# Patient Record
Sex: Female | Born: 1995 | Race: Black or African American | Hispanic: No | Marital: Single | Smoking: Never smoker
Health system: Southern US, Community
[De-identification: ages and names within clinical notes are randomized; demographics above are authoritative.]

## PROBLEM LIST (undated history)

## (undated) DIAGNOSIS — J302 Other seasonal allergic rhinitis: Secondary | ICD-10-CM

## (undated) DIAGNOSIS — H5000 Unspecified esotropia: Secondary | ICD-10-CM

## (undated) DIAGNOSIS — M6289 Other specified disorders of muscle: Secondary | ICD-10-CM

## (undated) DIAGNOSIS — Z993 Dependence on wheelchair: Secondary | ICD-10-CM

## (undated) DIAGNOSIS — G809 Cerebral palsy, unspecified: Secondary | ICD-10-CM

## (undated) DIAGNOSIS — G3184 Mild cognitive impairment, so stated: Secondary | ICD-10-CM

## (undated) DIAGNOSIS — R29898 Other symptoms and signs involving the musculoskeletal system: Secondary | ICD-10-CM

## (undated) DIAGNOSIS — G319 Degenerative disease of nervous system, unspecified: Secondary | ICD-10-CM

## (undated) DIAGNOSIS — Z87828 Personal history of other (healed) physical injury and trauma: Secondary | ICD-10-CM

## (undated) DIAGNOSIS — Z8739 Personal history of other diseases of the musculoskeletal system and connective tissue: Secondary | ICD-10-CM

## (undated) DIAGNOSIS — M25369 Other instability, unspecified knee: Secondary | ICD-10-CM

## (undated) HISTORY — DX: Cerebral palsy, unspecified: G80.9

---
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
## 2016-02-28 ENCOUNTER — Other Ambulatory Visit: Payer: Self-pay | Admitting: Pediatrics

## 2016-02-28 DIAGNOSIS — N6310 Unspecified lump in the right breast, unspecified quadrant: Secondary | ICD-10-CM

## 2016-04-27 ENCOUNTER — Ambulatory Visit
Admission: RE | Admit: 2016-04-27 | Discharge: 2016-04-27 | Disposition: A | Discharge status: Home / Self Care | Payer: Medicaid Other | Source: Ambulatory Visit | Attending: Pediatrics | Admitting: Pediatrics

## 2016-04-27 DIAGNOSIS — N6310 Unspecified lump in the right breast, unspecified quadrant: Secondary | ICD-10-CM

## 2016-05-02 NOTE — H&P (Signed)
Sheila LameJessica Roberson is an 20 y.o. female.   Chief Complaint: Esotropia,hyperopia,bilateral blurred vision, residual esotropia.  HPI: Esotropia,hyperopia,bilateral blurred vision, residual esotropia.  Past Medical History:  Diagnosis Date  . Cerebellar atrophy   . Cerebral palsy   . Developmental delay     Past Surgical History:  Procedure Laterality Date  . EXAMINATION UNDER ANESTHESIA N/A 01/01/2014   Procedure: EXAM UNDER ANESTHESIA;  Surgeon: [name], MD;  Location: [facility];  Service: Gynecology;  Laterality: N/A;  . INTRAUTERINE DEVICE (IUD) INSERTION N/A 01/01/2014   Procedure: INTRAUTERINE DEVICE (IUD) INSERTION;  Surgeon: [name], MD;  Location: [facility];  Service: Gynecology;  Laterality: N/A;  . IUD REMOVAL N/A 04/02/2014   Procedure: INTRAUTERINE DEVICE (IUD) REMOVAL;  Surgeon: [name], MD;  Location: [facility];  Service: Gynecology;  Laterality: N/A;    Family History  Problem Relation Age of Onset  . Heart failure Mother   . Hypertension Mother   . Diabetes Mother    Social History:  reports that she has never smoked. She has never used smokeless tobacco. She reports that she does not drink alcohol or use drugs.  Allergies: No Known Allergies  No prescriptions prior to admission.    No results found for this or any previous visit (from the past 48 hour(s)). No results found.  Review of Systems  Neurological: Negative for loss of consciousness.    There were no vitals taken for this visit. Physical Exam  Neurological:  Cerebella anthophy     Assessment/Plan Pt presents for Lateral rectus resection both eyes, for correction of residual esotropia both eyes.  Corinda GublerSPENCER,Micha Dosanjh A, MD 05/02/2016, 10:52 AM

## 2016-05-02 NOTE — H&P (Signed)
Sheila LameJessica Roberson is an 20 y.o. female.   Chief Complaint: Residual esotropia,hyperopia,bilateral blurred vision. ZOX:[id number]:Residual esotropia,hyperopia,bilateral blurred vision.    Past Medical History:  Diagnosis Date  . Cerebellar atrophy   . Cerebral palsy   . Developmental delay     Past Surgical History:  Procedure Laterality Date  . EXAMINATION UNDER ANESTHESIA N/A 01/01/2014   Procedure: EXAM UNDER ANESTHESIA;  Surgeon: [name], MD;  Location: [facility];  Service: Gynecology;  Laterality: N/A;  . INTRAUTERINE DEVICE (IUD) INSERTION N/A 01/01/2014   Procedure: INTRAUTERINE DEVICE (IUD) INSERTION;  Surgeon: [name], MD;  Location: [facility];  Service: Gynecology;  Laterality: N/A;  . IUD REMOVAL N/A 04/02/2014   Procedure: INTRAUTERINE DEVICE (IUD) REMOVAL;  Surgeon: [name], MD;  Location: [facility];  Service: Gynecology;  Laterality: N/A;    Family History  Problem Relation Age of Onset  . Heart failure Mother   . Hypertension Mother   . Diabetes Mother    Social History:  reports that she has never smoked. She has never used smokeless tobacco. She reports that she does not drink alcohol or use drugs.  Allergies:  Allergies  Allergen Reactions  . Perennial Rye Grass Pollen [Timothy Grass Pollen Allergen]     No prescriptions prior to admission.    No results found for this or any previous visit (from the past 48 hour(s)). No results found.  ROS  There were no vitals taken for this visit. Physical Exam  Eyes:       Assessment/Plan Pt presents for bilateral rectus resection both eyes for correction of residual esotropia both eyes.  Dewain Platz A, MD 05/02/2016, 11:18 AM

## 2016-05-10 ENCOUNTER — Encounter (HOSPITAL_BASED_OUTPATIENT_CLINIC_OR_DEPARTMENT_OTHER): Payer: Self-pay | Admitting: *Deleted

## 2016-05-15 ENCOUNTER — Encounter (HOSPITAL_BASED_OUTPATIENT_CLINIC_OR_DEPARTMENT_OTHER): Payer: Self-pay | Admitting: *Deleted

## 2016-05-15 NOTE — Progress Notes (Signed)
NPO AFTER MN.  ARRIVE AT 0745.  NEEDS HG AND URINE PREG. ?.  PT IS IN [employer] AND USES WALKER, BUT CAN STAND AND PIVOT TO STRETCHER.  PT CAN COMMUNICATE NEEDS AND SPEAK FOR HERSELF. MOTHER WILL BE WITH DOS. LM FOR DR Karleen HampshireSPENCER AGAIN FOR PRE-ORDERS ASAP.

## 2016-05-16 ENCOUNTER — Ambulatory Visit (HOSPITAL_BASED_OUTPATIENT_CLINIC_OR_DEPARTMENT_OTHER): Payer: Medicaid Other | Admitting: Anesthesiology

## 2016-05-16 ENCOUNTER — Encounter (HOSPITAL_BASED_OUTPATIENT_CLINIC_OR_DEPARTMENT_OTHER): Payer: Self-pay | Admitting: Anesthesiology

## 2016-05-16 ENCOUNTER — Encounter (HOSPITAL_BASED_OUTPATIENT_CLINIC_OR_DEPARTMENT_OTHER)
Admission: RE | Disposition: A | Discharge status: Home / Self Care | Payer: Self-pay | Source: Ambulatory Visit | Attending: Ophthalmology

## 2016-05-16 ENCOUNTER — Ambulatory Visit (HOSPITAL_BASED_OUTPATIENT_CLINIC_OR_DEPARTMENT_OTHER)
Admission: RE | Admit: 2016-05-16 | Discharge: 2016-05-16 | Disposition: A | Discharge status: Home / Self Care | Payer: Medicaid Other | Source: Ambulatory Visit | Attending: Ophthalmology | Admitting: Ophthalmology

## 2016-05-16 DIAGNOSIS — H5000 Unspecified esotropia: Secondary | ICD-10-CM | POA: Insufficient documentation

## 2016-05-16 DIAGNOSIS — G319 Degenerative disease of nervous system, unspecified: Secondary | ICD-10-CM | POA: Diagnosis not present

## 2016-05-16 DIAGNOSIS — R6250 Unspecified lack of expected normal physiological development in childhood: Secondary | ICD-10-CM | POA: Diagnosis not present

## 2016-05-16 DIAGNOSIS — G809 Cerebral palsy, unspecified: Secondary | ICD-10-CM | POA: Insufficient documentation

## 2016-05-16 HISTORY — DX: Personal history of other diseases of the musculoskeletal system and connective tissue: Z87.39

## 2016-05-16 HISTORY — DX: Dependence on wheelchair: Z99.3

## 2016-05-16 HISTORY — DX: Other symptoms and signs involving the musculoskeletal system: R29.898

## 2016-05-16 HISTORY — DX: Personal history of other (healed) physical injury and trauma: Z87.828

## 2016-05-16 HISTORY — DX: Other seasonal allergic rhinitis: J30.2

## 2016-05-16 HISTORY — PX: MEDIAN RECTUS REPAIR: SHX5301

## 2016-05-16 HISTORY — DX: Other instability, unspecified knee: M25.369

## 2016-05-16 HISTORY — DX: Unspecified esotropia: H50.00

## 2016-05-16 HISTORY — DX: Mild cognitive impairment of uncertain or unknown etiology: G31.84

## 2016-05-16 HISTORY — DX: Other specified disorders of muscle: M62.89

## 2016-05-16 LAB — POCT HEMOGLOBIN-HEMACUE: HEMOGLOBIN: 14.8 g/dL (ref 12.0–15.0)

## 2016-05-16 SURGERY — REPAIR, MUSCLE, MEDIAL RECTUS
Anesthesia: General | Site: Eye | Laterality: Bilateral

## 2016-05-16 MED ORDER — PROPOFOL 10 MG/ML IV BOLUS
INTRAVENOUS | Status: DC | PRN
  Administered 2016-05-16: 100 mg via INTRAVENOUS

## 2016-05-16 MED ORDER — GLYCOPYRROLATE 0.2 MG/ML IV SOSY
PREFILLED_SYRINGE | INTRAVENOUS | Status: DC | PRN
  Administered 2016-05-16: .3 mg via INTRAVENOUS

## 2016-05-16 MED ORDER — ONDANSETRON HCL 4 MG/2ML IJ SOLN
INTRAMUSCULAR | Status: AC
  Filled 2016-05-16: qty 2

## 2016-05-16 MED ORDER — DEXAMETHASONE SODIUM PHOSPHATE 4 MG/ML IJ SOLN
INTRAMUSCULAR | Status: DC | PRN
  Administered 2016-05-16: 10 mg via INTRAVENOUS

## 2016-05-16 MED ORDER — TOBRAMYCIN-DEXAMETHASONE 0.3-0.1 % OP OINT
TOPICAL_OINTMENT | OPHTHALMIC | Status: DC | PRN
  Administered 2016-05-16: 1 via OPHTHALMIC

## 2016-05-16 MED ORDER — KETOROLAC TROMETHAMINE 30 MG/ML IJ SOLN
INTRAMUSCULAR | Status: AC
  Filled 2016-05-16: qty 1

## 2016-05-16 MED ORDER — PHENYLEPHRINE HCL 2.5 % OP SOLN
OPHTHALMIC | Status: DC | PRN
  Administered 2016-05-16: 3 [drp] via OPHTHALMIC

## 2016-05-16 MED ORDER — MIDAZOLAM HCL 2 MG/2ML IJ SOLN
INTRAMUSCULAR | Status: AC
  Filled 2016-05-16: qty 2

## 2016-05-16 MED ORDER — BSS IO SOLN
INTRAOCULAR | Status: DC | PRN
  Administered 2016-05-16: 15 mL via INTRAOCULAR

## 2016-05-16 MED ORDER — ONDANSETRON HCL 4 MG/2ML IJ SOLN
INTRAMUSCULAR | Status: DC | PRN
  Administered 2016-05-16: 4 mg via INTRAVENOUS

## 2016-05-16 MED ORDER — LACTATED RINGERS IV SOLN
INTRAVENOUS | Status: DC
  Administered 2016-05-16: 10:00:00 via INTRAVENOUS
  Filled 2016-05-16: qty 1000

## 2016-05-16 MED ORDER — LIDOCAINE 2% (20 MG/ML) 5 ML SYRINGE
INTRAMUSCULAR | Status: AC
  Filled 2016-05-16: qty 5

## 2016-05-16 MED ORDER — ACETAMINOPHEN-CODEINE 120-12 MG/5ML PO SUSP: 5.0000 mL | Freq: Four times a day (QID) | ORAL | 0 refills | Status: AC | PRN

## 2016-05-16 MED ORDER — TOBRAMYCIN-DEXAMETHASONE 0.3-0.1 % OP OINT
1.0000 | TOPICAL_OINTMENT | Freq: Two times a day (BID) | OPHTHALMIC | 0 refills | Status: AC
Start: 2016-05-16 — End: ?

## 2016-05-16 MED ORDER — GLYCOPYRROLATE 0.2 MG/ML IV SOSY
PREFILLED_SYRINGE | INTRAVENOUS | Status: AC
  Filled 2016-05-16: qty 3

## 2016-05-16 MED ORDER — KETOROLAC TROMETHAMINE 30 MG/ML IJ SOLN
INTRAMUSCULAR | Status: DC | PRN
  Administered 2016-05-16: 30 mg via INTRAVENOUS

## 2016-05-16 MED ORDER — PHENYLEPHRINE 40 MCG/ML (10ML) SYRINGE FOR IV PUSH (FOR BLOOD PRESSURE SUPPORT)
PREFILLED_SYRINGE | INTRAVENOUS | Status: DC | PRN
  Administered 2016-05-16 (×2): 80 ug via INTRAVENOUS
  Administered 2016-05-16: 40 ug via INTRAVENOUS

## 2016-05-16 MED ORDER — FENTANYL CITRATE (PF) 100 MCG/2ML IJ SOLN
INTRAMUSCULAR | Status: AC
  Filled 2016-05-16: qty 2

## 2016-05-16 MED ORDER — DEXAMETHASONE SODIUM PHOSPHATE 10 MG/ML IJ SOLN
INTRAMUSCULAR | Status: AC
  Filled 2016-05-16: qty 1

## 2016-05-16 MED ORDER — FENTANYL CITRATE (PF) 100 MCG/2ML IJ SOLN
INTRAMUSCULAR | Status: DC | PRN
  Administered 2016-05-16 (×4): 25 ug via INTRAVENOUS

## 2016-05-16 MED ORDER — PROPOFOL 10 MG/ML IV BOLUS
INTRAVENOUS | Status: AC
  Filled 2016-05-16: qty 20

## 2016-05-16 SURGICAL SUPPLY — 34 items
APL SRG 3 HI ABS STRL LF PLS (MISCELLANEOUS) ×2
APPLICATOR DR MATTHEWS STRL (MISCELLANEOUS) ×5 IMPLANT
BANDAGE EYE OVAL (MISCELLANEOUS) IMPLANT
CAUTERY EYE LOW TEMP 1300F FIN (OPHTHALMIC RELATED) ×3 IMPLANT
CLOSURE WOUND 1/2 X4 (GAUZE/BANDAGES/DRESSINGS) ×1
CORDS BIPOLAR (ELECTRODE) IMPLANT
COVER BACK TABLE 60X90IN (DRAPES) ×3 IMPLANT
COVER MAYO STAND STRL (DRAPES) ×3 IMPLANT
DRAPE LG THREE QUARTER DISP (DRAPES) ×3 IMPLANT
DRAPE SURG 17X23 STRL (DRAPES) ×9 IMPLANT
GLOVE BIO SURGEON STRL SZ 6.5 (GLOVE) ×1 IMPLANT
GLOVE BIO SURGEONS STRL SZ 6.5 (GLOVE) ×1
GLOVE BIOGEL PI IND STRL 6.5 (GLOVE) IMPLANT
GLOVE BIOGEL PI IND STRL 7.5 (GLOVE) ×1 IMPLANT
GLOVE BIOGEL PI INDICATOR 6.5 (GLOVE) ×2
GLOVE BIOGEL PI INDICATOR 7.5 (GLOVE) ×2
GLOVE SURG SIGNA 7.5 PF LTX (GLOVE) ×3 IMPLANT
GOWN STRL REUS W/ TWL LRG LVL3 (GOWN DISPOSABLE) ×1 IMPLANT
GOWN STRL REUS W/TWL LRG LVL3 (GOWN DISPOSABLE) ×4 IMPLANT
KIT ROOM TURNOVER WOR (KITS) ×3 IMPLANT
MANIFOLD NEPTUNE II (INSTRUMENTS) IMPLANT
MARKER PEN SURG W/LABELS BLK (STERILIZATION PRODUCTS) ×3 IMPLANT
NS IRRIG 500ML POUR BTL (IV SOLUTION) ×1 IMPLANT
PACK BASIN DAY SURGERY FS (CUSTOM PROCEDURE TRAY) ×3 IMPLANT
SPEAR EYE SURGICAL ST (MISCELLANEOUS) IMPLANT
STRIP CLOSURE SKIN 1/2X4 (GAUZE/BANDAGES/DRESSINGS) ×2 IMPLANT
SUT VICRYL 6 0 S 29 12 (SUTURE) ×7 IMPLANT
SUT VICRYL 7 0 TG140 8 (SUTURE) IMPLANT
SUT VICRYL 8 0 TG140 8 (SUTURE) IMPLANT
TOWEL OR 17X24 6PK STRL BLUE (TOWEL DISPOSABLE) ×5 IMPLANT
TRAY DSU PREP LF (CUSTOM PROCEDURE TRAY) ×3 IMPLANT
TUBE CONNECTING 12'X1/4 (SUCTIONS)
TUBE CONNECTING 12X1/4 (SUCTIONS) IMPLANT
WATER STERILE IRR 500ML POUR (IV SOLUTION) ×3 IMPLANT

## 2016-05-16 NOTE — Interval H&P Note (Signed)
History and Physical Interval Note:  05/16/2016 9:40 AM  Sheila Roberson  has presented today for surgery, with the diagnosis of ESOTROPHIA  The various methods of treatment have been discussed with the patient and family. After consideration of risks, benefits and other options for treatment, the patient has consented to  Procedure(s): MEDIAN RECTUS REPAIR (Bilateral) as a surgical intervention .  The patient's history has been reviewed, patient examined, no change in status, stable for surgery.  I have reviewed the patient's chart and labs.  Questions were answered to the patient's satisfaction.     Febe Champa A   

## 2016-05-16 NOTE — Transfer of Care (Signed)
Immediate Anesthesia Transfer of Care Note  Patient: Sheila Roberson  Procedure(s) Performed: Procedure(s): LATERAL RECTUS RESECTION BOTH EYES (Bilateral)  Patient Location: PACU  Anesthesia Type:General  Level of Consciousness: sedated and responds to stimulation  Airway & Oxygen Therapy: Patient Spontanous Breathing  Post-op Assessment: Report given to RN  Post vital signs: Reviewed and stable  Last Vitals: 118/63, 98, 17, 99%, 98.6  Vitals:   05/16/16 0815 05/16/16 1112  BP: 113/73 120/64  Pulse: 80 93  Resp: 18 13  Temp: 37.1 C 37 C    Last Pain:  Vitals:   05/16/16 0815  TempSrc: Oral      Patients Stated Pain Goal: 5 (05/16/16 0847)  Complications: No apparent anesthesia complications

## 2016-05-16 NOTE — Discharge Instructions (Signed)
°  Post Anesthesia Home Care Instructions  Activity: Get plenty of rest for the remainder of the day. A responsible adult should stay with you for 24 hours following the procedure.  For the next 24 hours, DO NOT: -Drive a car -Advertising copywriterperate machinery -Drink alcoholic beverages -Take any medication unless instructed by your physician -Make any legal decisions or sign important papers.  Meals: Start with liquid foods such as gelatin or soup. Progress to regular foods as tolerated. Avoid greasy, spicy, heavy foods. If nausea and/or vomiting occur, drink only clear liquids until the nausea and/or vomiting subsides. Call your physician if vomiting continues.  Special Instructions/Symptoms: Your throat may feel dry or sore from the anesthesia or the breathing tube placed in your throat during surgery. If this causes discomfort, gargle with warm salt water. The discomfort should disappear within 24 hours.  If you had a scopolamine patch placed behind your ear for the management of post- operative nausea and/or vomiting:  1. The medication in the patch is effective for 72 hours, after which it should be removed.  Wrap patch in a tissue and discard in the trash. Wash hands thoroughly with soap and water. 2. You may remove the patch earlier than 72 hours if you experience unpleasant side effects which may include dry mouth, dizziness or visual disturbances. 3. Avoid touching the patch. Wash your hands with soap and water after contact with the patch.   Call your surgeon if you experience:   1.  Fever over 101.0. 2.  Inability to urinate. 3.  Nausea and/or vomiting. 4.  Extreme swelling or bruising at the surgical site. 5.  Continued bleeding from the incision. 6.  Increased pain, redness or drainage from the incision. 7.  Problems related to your pain medication. 8.  Any visual changes 9.  Any problems and/or concerns

## 2016-05-16 NOTE — Brief Op Note (Signed)
05/16/2016  11:05 AM  PATIENT:  Cindee LameJessica Cervini  20 y.o. female  PRE-OPERATIVE DIAGNOSIS:  ESOTROPHIA  POST-OPERATIVE DIAGNOSIS:  ESOTROPHIA  PROCEDURE:  Procedure(s): LATERAL RECTUS RESECTION BOTH EYES (Bilateral)  SURGEON:  Surgeon(s) and Role:    * Aura CampsMichael Armella Stogner, MD - Primary  PHYSICIAN ASSISTANT:   ASSISTANTS: none   ANESTHESIA:   general  EBL:  No intake/output data recorded.  BLOOD ADMINISTERED:none  DRAINS: none   LOCAL MEDICATIONS USED:  NONE  SPECIMEN:  No Specimen  DISPOSITION OF SPECIMEN:  N/A  COUNTS:  YES  TOURNIQUET:  * No tourniquets in log *  DICTATION: .Other Dictation: Dictation Number [id number]  PLAN OF CARE: Discharge to home after PACU  PATIENT DISPOSITION:  PACU - hemodynamically stable.   Delay start of Pharmacological VTE agent (>24hrs) due to surgical blood loss or risk of bleeding: yes

## 2016-05-16 NOTE — Interval H&P Note (Signed)
History and Physical Interval Note:  05/16/2016 9:41 AM  Sheila Roberson  has presented today for surgery, with the diagnosis of ESOTROPHIA  The various methods of treatment have been discussed with the patient and family. After consideration of risks, benefits and other options for treatment, the patient has consented to  Procedure(s): MEDIAN RECTUS REPAIR (Bilateral) as a surgical intervention .  The patient's history has been reviewed, patient examined, no change in status, stable for surgery.  I have reviewed the patient's chart and labs.  Questions were answered to the patient's satisfaction.     Kayah Hecker A

## 2016-05-16 NOTE — Interval H&P Note (Signed)
History and Physical Interval Note:  05/16/2016 9:40 AM  [name]  has presented today for surgery, with the diagnosis of ESOTROPHIA  The various methods of treatment have been discussed with the patient and family. After consideration of risks, benefits and other options for treatment, the patient has consented to  Procedure(s): MEDIAN RECTUS REPAIR (Bilateral) as a surgical intervention .  The patient's history has been reviewed, patient examined, no change in status, stable for surgery.  I have reviewed the patient's chart and labs.  Questions were answered to the patient's satisfaction.     Meral Geissinger A

## 2016-05-16 NOTE — Anesthesia Procedure Notes (Signed)
Procedure Name: LMA Insertion Date/Time: 05/16/2016 9:45 AM Performed by: Briant SitesENENNY, Reynalda Canny T Pre-anesthesia Checklist: Patient identified, Emergency Drugs available, Suction available and Patient being monitored Patient Re-evaluated:Patient Re-evaluated prior to inductionOxygen Delivery Method: Circle system utilized Intubation Type: Inhalational induction Ventilation: Mask ventilation without difficulty LMA: LMA flexible inserted LMA Size: 4.0 Number of attempts: 1 Placement Confirmation: positive ETCO2 Tube secured with: Tape Dental Injury: Teeth and Oropharynx as per pre-operative assessment

## 2016-05-16 NOTE — Anesthesia Preprocedure Evaluation (Signed)
Anesthesia Evaluation  Patient identified by MRN, date of birth, ID band Patient awake    Reviewed: Allergy & Precautions, NPO status , Patient's Chart, lab work & pertinent test results  Airway Mallampati: II   Neck ROM: full    Dental   Pulmonary neg pulmonary ROS,    breath sounds clear to auscultation       Cardiovascular negative cardio ROS   Rhythm:regular Rate:Normal     Neuro/Psych Cerebral palsy.  Cerebellar atrophy    GI/Hepatic   Endo/Other    Renal/GU      Musculoskeletal   Abdominal   Peds  Hematology   Anesthesia Other Findings   Reproductive/Obstetrics                             Anesthesia Physical Anesthesia Plan  ASA: II  Anesthesia Plan: General   Post-op Pain Management:    Induction: Intravenous  Airway Management Planned: Oral ETT  Additional Equipment:   Intra-op Plan:   Post-operative Plan: Extubation in OR  Informed Consent: I have reviewed the patients History and Physical, chart, labs and discussed the procedure including the risks, benefits and alternatives for the proposed anesthesia with the patient or authorized representative who has indicated his/her understanding and acceptance.     Plan Discussed with: CRNA, Anesthesiologist and Surgeon  Anesthesia Plan Comments:         Anesthesia Quick Evaluation

## 2016-05-17 ENCOUNTER — Encounter (HOSPITAL_BASED_OUTPATIENT_CLINIC_OR_DEPARTMENT_OTHER): Payer: Self-pay | Admitting: Ophthalmology

## 2016-05-17 NOTE — Op Note (Signed)
NAMCindee Roberson:  Sheila Roberson, Sheila Roberson              ACCOUNT NO.:  [id number]  MEDICAL RECORD NO.:  [id number]  LOCATION:                                 FACILITY:  PHYSICIAN:  Tyrone AppleMichael A. 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  POSTOPERATIVE DIAGNOSIS:  Status post bilateral lateral rectus resections.  PROCEDURE:  Bilateral lateral rectus recessions of 6 mm.  SURGEON:  Aura CampsMichael Ferrell Flam, MD.  ANESTHESIA:  General with laryngeal mask airway.  INDICATION FOR PROCEDURE:  Sheila LameJessica Pigeon is a 20 year old female with cerebral palsy and congenital esotropia.  She is status post bilateral medial rectus recessions in 2012 for esotropia.  She presents with residual esotropia.  This procedure is indicated to restore alignment of visual axis and restore single binocular vision and all positions of gaze.  The risks and benefits of the procedure were explained to the patient's parents.  Prior to the procedure, informed consent was obtained.  DESCRIPTION OF TECHNIQUE:  The patient taken into the operating room, placed in a supine position, and the entire face was prepped and draped in the usual sterile fashion after induction via general anesthesia and establishment of laryngeal mask airway.  My attention was first directed to the right eye.  A lid speculum was placed.  Forced duction tests were performed and found to be negative.  The globe was then held in the inferior temporal quadrant.  The eye was elevated and adducted.An Incision was made through the inferior temporal fornix and taken down to the posterior sub-tenons space.  The left lateral rectus tendon was then isolated on a Stevens hook, subsequently on a Green hook.  A second Green hook was then passed beneath the tendon and this was used to hold the  globe in an elevated and adducted position.  Next, the tendon was then carefully dissected free from its overlying muscle fascia and intermuscular septum.  Marks were placed on the tendon 6 mm from its native insertion.  The tendon was then imbricated on 6-0 Vicryl suture taking 2 locking bites at medial temporal apices at the pre-placed mark. The tendon was then advanced to its native insertion site on the pre- placed sutures via plication performing a 6 mm resection.  Sutures were tied securely and conjunctiva was repositioned.  My attention was then directed to thefellow left eye where an identical left lateral rectus recession of 6 mm was performed using the technique described above.  At the conclusion of procedure, TobraDex ointment was instilled in inferior fornices in both eyes.  There were no apparent complications.     Casimiro NeedleMichael A. Karleen HampshireSpencer, M.D.   ______________________________ Tyrone AppleMichael A. Karleen HampshireSpencer, M.D.    MAS/MEDQ  D:  05/16/2016  T:  05/17/2016  Job:  [id number]

## 2016-05-21 NOTE — Anesthesia Postprocedure Evaluation (Signed)
Anesthesia Post Note  Patient: Sheila Roberson  Procedure(s) Performed: Procedure(s) (LRB): LATERAL RECTUS RESECTION BOTH EYES (Bilateral)  Patient location during evaluation: PACU Anesthesia Type: General Level of consciousness: awake and alert and patient cooperative Pain management: pain level controlled Vital Signs Assessment: post-procedure vital signs reviewed and stable Respiratory status: spontaneous breathing and respiratory function stable Cardiovascular status: stable Anesthetic complications: no    Last Vitals:  Vitals:   05/16/16 1131 05/16/16 1200  BP: (!) 118/98 125/69  Pulse:  79  Resp: 15 16  Temp:  36.9 C    Last Pain:  Vitals:   05/17/16 1440  TempSrc:   PainSc: 0-No pain                 Myreon Wimer S

## 2016-11-27 ENCOUNTER — Other Ambulatory Visit: Payer: Self-pay | Admitting: Pediatrics

## 2016-11-27 DIAGNOSIS — N6310 Unspecified lump in the right breast, unspecified quadrant: Secondary | ICD-10-CM

## 2016-12-11 ENCOUNTER — Ambulatory Visit
Admission: RE | Admit: 2016-12-11 | Discharge: 2016-12-11 | Disposition: A | Discharge status: Home / Self Care | Payer: Self-pay | Source: Ambulatory Visit | Attending: Pediatrics | Admitting: Pediatrics

## 2016-12-11 DIAGNOSIS — N6310 Unspecified lump in the right breast, unspecified quadrant: Secondary | ICD-10-CM

## 2017-11-07 ENCOUNTER — Other Ambulatory Visit: Payer: Self-pay | Admitting: Pediatrics

## 2017-11-07 DIAGNOSIS — N6310 Unspecified lump in the right breast, unspecified quadrant: Secondary | ICD-10-CM

## 2017-12-13 ENCOUNTER — Ambulatory Visit
Admission: RE | Admit: 2017-12-13 | Discharge: 2017-12-13 | Disposition: A | Discharge status: Home / Self Care | Payer: Medicaid Other | Source: Ambulatory Visit | Attending: Pediatrics | Admitting: Pediatrics

## 2017-12-13 DIAGNOSIS — N6310 Unspecified lump in the right breast, unspecified quadrant: Secondary | ICD-10-CM

## 2018-02-21 ENCOUNTER — Encounter: Payer: Self-pay | Admitting: Certified Nurse Midwife

## 2018-02-21 ENCOUNTER — Ambulatory Visit (INDEPENDENT_AMBULATORY_CARE_PROVIDER_SITE_OTHER): Payer: Medicaid Other | Admitting: Certified Nurse Midwife

## 2018-02-21 VITALS — BP 113/75 | HR 81 | Ht 65.0 in | Wt 147.8 lb

## 2018-02-21 DIAGNOSIS — Z0000 Encounter for general adult medical examination without abnormal findings: Secondary | ICD-10-CM | POA: Diagnosis not present

## 2018-02-21 DIAGNOSIS — G809 Cerebral palsy, unspecified: Secondary | ICD-10-CM | POA: Diagnosis not present

## 2018-02-21 NOTE — Progress Notes (Signed)
Patient is in the office with her mother (authorized person) for annual. Mother states that pt had IUD removed in 2016 under sedation and [facility] LMP 02-08-18.

## 2018-02-21 NOTE — Progress Notes (Signed)
GYNECOLOGY ANNUAL PREVENTATIVE CARE ENCOUNTER NOTE  Subjective:   Sheila Roberson is a 22 y.o. G0P0 female here for a routine annual gynecologic exam.  Current complaints: none. Information obtained by mother who is the authorized person. Denies abnormal vaginal bleeding (menstrual chart reviewed), discharge, pelvic pain, problems with intercourse or other gynecologic concerns.    Gynecologic History Patient's last menstrual period was 02/08/2018. Contraception: none Has not has PAP, mother reports patient will not tolerate pelvic examinations without sedation. IUD removed in 2016 at [facility] under sedation.   Obstetric History OB History  Gravida Para Term Preterm AB Living  0 0          SAB TAB Ectopic Multiple Live Births               Past Medical History:  Diagnosis Date  . Cerebellar atrophy   . 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 Esotropia of both eyes   . History of dislocation of knee    bilateral---  wear knee braces  . Knee instability    BILATERAL WEAR BRACES  . Low muscle tone   . Mild cognitive impairment   . Seasonal allergies   . Uses wheelchair     Past Surgical History:  Procedure Laterality Date  . EXAMINATION UNDER ANESTHESIA N/A 01/01/2014   Procedure: EXAM UNDER ANESTHESIA;  Surgeon: Antionette Char, MD;  Location: [facility];  Service: Gynecology;  Laterality: N/A;  . INTRAUTERINE DEVICE (IUD) INSERTION N/A 01/01/2014   Procedure: INTRAUTERINE DEVICE (IUD) INSERTION;  Surgeon: Antionette Char, MD;  Location: [facility];  Service: Gynecology;  Laterality: N/A;  . IUD REMOVAL N/A 04/02/2014   Procedure: INTRAUTERINE DEVICE (IUD) REMOVAL;  Surgeon: Antionette Char, MD;  Location: [facility];  Service: Gynecology;  Laterality: N/A;  . MEDIAN RECTUS REPAIR Bilateral 05/16/2016   Procedure: LATERAL RECTUS RESECTION BOTH EYES;  Surgeon:  Aura Camps, MD;  Location: [facility];  Service: Ophthalmology;  Laterality: Bilateral;  . STRABISMUS SURGERY  2013    Current Outpatient Medications on File Prior to Visit  Medication Sig Dispense Refill  . acetaminophen-codeine (CAPITAL/CODEINE) 120-12 MG/5ML suspension Take 5 mLs by mouth every 6 (six) hours as needed for pain. 120 mL 0  . cetirizine HCl (ZYRTEC) 5 MG/5ML SYRP Take 5 mg by mouth as needed for allergies.    . Chlorphen-Phenyleph-ASA (ALKA-SELTZER PLUS COLD PO) Take by mouth as needed.    . diphenhydrAMINE (BENADRYL) 12.5 MG/5ML elixir Take 25 mg by mouth daily as needed for allergies.    . flintstones complete (FLINTSTONES) 60 MG chewable tablet Chew 2 tablets by mouth daily.    Marland Kitchen ibuprofen (ADVIL,MOTRIN) 200 MG tablet Take 200 mg by mouth daily as needed for moderate pain.    Marland Kitchen tobramycin-dexamethasone (TOBRADEX) ophthalmic ointment Place 1 application into both eyes 2 (two) times daily at 10 am and 4 pm. 3.5 g 0   No current facility-administered medications on file prior to visit.     Allergies  Allergen Reactions  . Perennial Rye Grass Pollen [Timothy Grass Pollen Allergen]     Social History   Socioeconomic History  . Marital status: Single    Spouse name: Not on file  . Number of children: Not on file  . Years of education: Not on file  . Highest education level: Not on file  Occupational History  .  Not on file  Social Needs  . Financial resource strain: Not on file  . Food insecurity:    Worry: Not on file    Inability: Not on file  . Transportation needs:    Medical: Not on file    Non-medical: Not on file  Tobacco Use  . Smoking status: Never Smoker  . Smokeless tobacco: Never Used  Substance and Sexual Activity  . Alcohol use: No  . Drug use: No  . Sexual activity: Never    Birth control/protection: None    Comment: PT GOES TO [place] [employer]  Lifestyle  . Physical activity:    Days per week: Not on file     Minutes per session: Not on file  . Stress: Not on file  Relationships  . Social connections:    Talks on phone: Not on file    Gets together: Not on file    Attends religious service: Not on file    Active member of club or organization: Not on file    Attends meetings of clubs or organizations: Not on file    Relationship status: Not on file  . Intimate partner violence:    Fear of current or ex partner: Not on file    Emotionally abused: Not on file    Physically abused: Not on file    Forced sexual activity: Not on file  Other Topics Concern  . Not on file  Social History Narrative  . Not on file    Family History  Problem Relation Age of Onset  . Heart failure Mother   . Hypertension Mother   . Diabetes Mother     The following portions of the patient's history were reviewed and updated as appropriate: allergies, current medications, past family history, past medical history, past social history, past surgical history and problem list.  Review of Systems Pertinent items noted in HPI and remainder of comprehensive ROS otherwise negative.   Objective:  BP 113/75   Pulse 81   Ht 5\' 5"  (1.651 m)   Wt 147 lb 12.8 oz (67 kg)   LMP 02/08/2018   BMI 24.60 kg/m  CONSTITUTIONAL: well-nourished female in no acute distress.  HENT:  Normocephalic, atraumatic, External right and left ear normal. Oropharynx is clear and moist EYES: Conjunctivae and EOM are normal. Pupils are equal, round, and reactive to light. NECK: Normal range of motion, supple, no masses.  Normal thyroid.  SKIN: Skin is warm and dry. No rash noted. Not diaphoretic. No erythema. No pallor. MUSCULOSKELETAL: No cyanosis, clubbing, or edema noted. Abnormal gait due to cerebral palsy, low muscle tone, bilateral knee braces in place. CARDIOVASCULAR: Normal heart rate noted, regular rhythm RESPIRATORY: Clear to auscultation bilaterally. Effort and breath sounds normal, no problems with respiration noted. BREASTS:  Symmetric in size. No masses palpated, no skin changes, nipple drainage, or lymphadenopathy.  ABDOMEN: Soft, normal bowel sounds, no distention noted.  No tenderness, rebound or guarding.   Assessment and Plan:  1. Encounter for annual physical examination excluding gynecological examination in a patient older than 17 years -Annual examination completed without pelvic examination due to patient having to be sedated for pelvic exam.  -Physical examination normal excluding effects of Cerebral Palsy   2. Cerebral palsy (HCC) -Patient in wheelchair, assessment and examination done while patient in wheelchair.   Routine preventative health maintenance measures emphasized. Please refer to After Visit Summary for other counseling recommendations.   Sharyon CableVeronica C Britiney Blahnik, CNM 02/21/18, 11:44 AM

## 2019-03-19 ENCOUNTER — Ambulatory Visit (INDEPENDENT_AMBULATORY_CARE_PROVIDER_SITE_OTHER): Payer: Medicaid Other | Admitting: Family Medicine

## 2019-03-19 ENCOUNTER — Other Ambulatory Visit: Payer: Self-pay

## 2019-03-19 ENCOUNTER — Encounter: Payer: Self-pay | Admitting: Family Medicine

## 2019-03-19 VITALS — BP 114/75 | HR 100 | Ht 64.25 in | Wt 150.0 lb

## 2019-03-19 DIAGNOSIS — Z0000 Encounter for general adult medical examination without abnormal findings: Secondary | ICD-10-CM | POA: Diagnosis not present

## 2019-03-19 DIAGNOSIS — Z01419 Encounter for gynecological examination (general) (routine) without abnormal findings: Secondary | ICD-10-CM

## 2019-03-19 NOTE — Progress Notes (Signed)
Pt presents for New patient AEX.

## 2019-03-19 NOTE — Progress Notes (Signed)
   GYNECOLOGY OFFICE VISIT NOTE History:  23 y.o. G0P0 here today for well-woman visit. She has a PMH of cerebral palsy with limited verbal communication, in a wheelchair. Her mother brought her in for visit today. States tried to make telehealth visit but was told she needed to come in. States daughter will be unable to do GYN exam without sedation. Not sexually active. Lives at home with parents. Has caregivers during the day. Recently finished school. Has regular periods, does not seem to be too bothered by them according to her mother. Gets flu shot every year, UTD on other vaccines. Eats relatively varied diet. Regular bowel movements. Gets some exercise at home, is in wheelchair because of knee instability. No trouble with sleeping. Mother states she feels supported, has resources she needs at this time.  The following portions of the patient's history were reviewed and updated as appropriate: allergies, current medications, past family history, past medical history, past social history, past surgical history and problem list.   Review of Systems:  Pertinent items noted in HPI. Limited ROS because of patient verbal ability. Review of Systems  Constitutional: Negative for malaise/fatigue and weight loss.  HENT: Negative for congestion.   Respiratory: Negative for cough and shortness of breath.   Gastrointestinal: Negative for constipation and diarrhea.  Genitourinary: Negative for frequency and urgency.  Skin: Negative for rash.    Objective:  Physical Exam BP 114/75   Pulse 100   Ht 5' 4.25" (1.632 m)   Wt 150 lb (68 kg)   LMP 02/26/2019   BMI 25.55 kg/m  Physical Exam  Constitutional: She appears well-developed and well-nourished. No distress.  Interactive, able to wave and say hello and goodbye, points to things desired, able to follow commands  HENT:  Head: Normocephalic and atraumatic.  Right Ear: External ear normal.  Left Ear: External ear normal.  Eyes: Conjunctivae and  EOM are normal. Right eye exhibits no discharge. Left eye exhibits no discharge.  Neck: Neck supple. No thyromegaly present.  Cardiovascular: Normal rate.  Pulmonary/Chest: Effort normal. No respiratory distress.  Genitourinary:    Genitourinary Comments: deferred   Musculoskeletal:        General: No edema.  Lymphadenopathy:    She has no cervical adenopathy.  Neurological: She exhibits normal muscle tone.  Alert and interactive  Skin: Skin is warm and dry. No rash noted.  Psychiatric: She has a normal mood and affect.  Nursing note and vitals reviewed.  Labs and Imaging No results found for this or any previous visit (from the past 168 hour(s)). No results found.  Assessment & Plan:  Sheila Roberson is a 23yo with PMH of cerebral palsy who presents for well-woman visit.  -- counseled mother on routine preventive items -- discussed recommendations for Pap smear, mother thinks she has had HPV vaccine series -- continue healthy diet and exercise as able to aid in mobility, healthy weight   Total face-to-face time with patient: 20 minutes.  Over 50% of encounter was spent on counseling and coordination of care.  Lambert Mody. Juleen China, DO OB Family Medicine Fellow, [facility] for [employer], [place]

## 2019-08-28 ENCOUNTER — Ambulatory Visit: Payer: Medicaid Other | Attending: Nurse Practitioner

## 2019-08-28 ENCOUNTER — Other Ambulatory Visit: Payer: Self-pay

## 2019-08-28 DIAGNOSIS — R2689 Other abnormalities of gait and mobility: Secondary | ICD-10-CM

## 2019-08-28 DIAGNOSIS — M6281 Muscle weakness (generalized): Secondary | ICD-10-CM | POA: Diagnosis present

## 2019-08-28 NOTE — Therapy (Addendum)
[facility] [address] [place], [place], [address] Phone: [phone]   Fax:  [phone]  Physical Therapy Evaluation  Patient Details  Name: Sheila Roberson MRN: [id number] Date of Birth: 1996-07-27 Referring Provider (PT): Azzie Roup, FNP   Encounter Date: 08/28/2019  PT End of Session - 08/28/19 1357    Visit Number  1    Number of Visits  7    Date for PT Re-Evaluation  10/27/19    Authorization Type  Medicaid    PT Start Time  1400    PT Stop Time  1443    PT Time Calculation (min)  43 min    Equipment Utilized During Treatment  --   posterior walker   Activity Tolerance  Patient tolerated treatment well    Behavior During Therapy  [facility] for tasks assessed/performed       Past Medical History:  Diagnosis Date  . Cerebellar atrophy (HCC)   . 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 Esotropia of both eyes   . History of dislocation of knee    bilateral---  wear knee braces  . Knee instability    BILATERAL WEAR BRACES  . Low muscle tone   . Mild cognitive impairment   . Seasonal allergies   . Uses wheelchair     Past Surgical History:  Procedure Laterality Date  . EXAMINATION UNDER ANESTHESIA N/A 01/01/2014   Procedure: EXAM UNDER ANESTHESIA;  Surgeon: Antionette Char, MD;  Location: [facility];  Service: Gynecology;  Laterality: N/A;  . INTRAUTERINE DEVICE (IUD) INSERTION N/A 01/01/2014   Procedure: INTRAUTERINE DEVICE (IUD) INSERTION;  Surgeon: Antionette Char, MD;  Location: [facility];  Service: Gynecology;  Laterality: N/A;  . IUD REMOVAL N/A 04/02/2014   Procedure: INTRAUTERINE DEVICE (IUD) REMOVAL;  Surgeon: Antionette Char, MD;  Location: [facility];  Service: Gynecology;  Laterality: N/A;  . MEDIAN RECTUS REPAIR Bilateral 05/16/2016   Procedure: LATERAL RECTUS RESECTION BOTH EYES;   Surgeon: Aura Camps, MD;  Location: [facility];  Service: Ophthalmology;  Laterality: Bilateral;  . STRABISMUS SURGERY  2013    There were no vitals filed for this visit.   Subjective Assessment - 08/28/19 1356    Subjective  Pt is 23 y/o female referred for cerebellar atrophty with cerebral palsy. Mother present and reports they are here to get letter for grab bar in bathroom. Mother also reports that she is also having more knee instabiliy with patellas subluxing if she does not wear her knee braces. Pt is nonverbal. Mom reports that she is walking on treadmill slowly 30 min 3 x/week. Pt participate in softball and bowling at [facility] [place].    Patient is accompained by:  Family member    Pertinent History  cerebral palsy    Patient Stated Goals  Pt would like to be able to return to her sports of softball and bowling and perform better. Per mother, it would be nice if she could stand and use walker for softball instead of being pushed in w/c.    Currently in Pain?  No/denies         [employer] PT Assessment - 08/28/19 1411      Assessment   Medical Diagnosis  cerebellar atrophy    Referring Provider (PT)  Azzie Roup, FNP    Onset Date/Surgical Date  08/26/19  referral date, chronic condition   Hand Dominance  Right      Precautions   Precautions  Fall      Balance Screen   Has the patient fallen in the past 6 months  No    Has the patient had a decrease in activity level because of a fear of falling?   Yes    Is the patient reluctant to leave their home because of a fear of falling?   Yes      [place] residence    Living Arrangements  Parent    Available Help at Discharge  Family    Type of [place] entrance;Stairs to enter    Entrance Stairs-Number of Steps  2    Entrance Stairs-Rails  Right;Left;Can reach both    [place]  One level    Home Equipment  Wheelchair - [employer]  - 2 wheels;Bedside commode;Grab bars - tub/shower;Tub bench   crocodile walker     Prior Function   Level of Independence  Needs assistance with ADLs;Needs assistance with gait    Vocation  Student   just graduated   Leisure  softball and bowling through [employer] assists with buttons and such and making sure clothes are on straight. Mom assist with bathing. Pt walks in home with assist with crocodile gait trainer walker. Uses w/c when participating in sports and walks with walker in stores with mother assist.      Cognition   Overall Cognitive Status  --   nonverbal but follows commands >80% of time     Sensation   Light Touch  Appears Intact      ROM / Strength   AROM / PROM / Strength  Strength      Strength   Overall Strength Comments  Pt is grossly 4/5 UE and BLE strength.      Transfers   Transfers  Sit to Stand;Stand to Sit    Sit to Stand  4: Min guard    Five time sit to stand comments   28 sec from wheelchair    Stand to Sit  4: Min guard      Ambulation/Gait   Ambulation/Gait  Yes    Ambulation/Gait Assistance  4: Min guard;4: Min assist    Ambulation/Gait Assistance Details  PT assisted to maneuver walker with turning and in tighter quarters.    Ambulation Distance (Feet)  150 Feet    Assistive device  --   posterior walker   Gait Pattern  Step-through pattern    Ambulation Surface  Level;Indoor      Balance   Balance Assessed  Yes      High Level Balance   High Level Balance Comments  Pt stood about 5 sec without UE support with taking step away from wheelchair. Tends to brace legs against back of chair for support most of time.                Objective measurements completed on examination: See above findings.              PT Education - 08/28/19 1926    Education Details  Pt and mother educated on PT plan of care    Person(s) Educated  Patient;Parent(s)    Methods  Explanation    Comprehension  Verbalized  understanding       PT Short  Term Goals - 08/28/19 1949      PT SHORT TERM GOAL #1   Title  STG=LTG        PT Long Term Goals - 08/28/19 1949      PT LONG TERM GOAL #1   Title  Pt and family with be instructed in HEP for balance, strength and walking program to continue gains at home.    Baseline  currently only walking on treadmill 3x/week    Time  6    Period  Weeks    Status  New    Target Date  10/23/19   due to delay in scheduling     PT LONG TERM GOAL #2   Title  Pt will be able to stand at posterior walking and mimic t-ball batting for improved ability to participate in softball.    Baseline  currently has to perform from w/c.    Time  6    Period  Weeks    Status  New    Target Date  10/23/19      PT LONG TERM GOAL #3   Title  Pt will ambulate >300' with posterior walker supervision/CGA for improved gait safety and to be able to walk around  bases in softball.    Baseline  150' with posterior walker min assist to manuever, using w/c pushed by aide in softball.    Time  6    Period  Weeks    Status  New    Target Date  10/23/19             Plan - 08/28/19 1928    Clinical Impression Statement  Pt is 23 y/o with cerebellar atrophy and cerebral palsy. Pt presents with mother to help obtain medical necessity for grab bar installation in bathroom. Per video of bathroom in home from patient's mother, patient had been utilizing towel rack in front of toilet to steady herself when toileting. Wall is starting to crack as towel rack not meant to support weight. Pt is high fall risk and needs support to maintain standing. Pt braces legs on chair behind her to maintain balance. Without UE support, only able to stand about 5 sec with close SBA for safety. Pt will benefit from grab bar to improve safety with toileting. Pt has decreased coordination with gait and ambulates with posterior walker needing assist to guide for safety. Pt would like to improve ability to  participate in special needs sports with use of walker for softball if possible. Pt will benefit from PT to work on balance, strengthening and gait HEP that parents can continue with at home.    Examination-Activity Limitations  Transfers;Locomotion Level;Toileting;Stand;Bathing    Examination-Participation Restrictions  Community Activity    Stability/Clinical Decision Making  Stable/Uncomplicated    Clinical Decision Making  Moderate    Rehab Potential  Good    PT Frequency  1x / week   plus eval   PT Duration  6 weeks    PT Treatment/Interventions  ADLs/Self Care Home Management;DME Instruction;Gait training;Stair training;Functional mobility training;Therapeutic activities;Therapeutic exercise;Patient/family education;Neuromuscular re-education;Balance training    PT Next Visit Plan  Balance training working on standing in posterior walker with activities to simulate softball batting, gait training with posterior walker    Consulted and Agree with Plan of Care  Patient;Family member/caregiver    Family Member Consulted  mom       Patient will benefit from skilled therapeutic intervention in order to improve the following deficits and impairments:  Abnormal gait, Decreased balance, Decreased coordination, Postural dysfunction, Decreased strength, Decreased knowledge of use of DME  Visit Diagnosis: Other abnormalities of gait and mobility  Muscle weakness (generalized)     Problem List There are no problems to display for this patient.   Ronn Melena, PT, DPT, NCS 08/28/2019, 7:58 PM  [facility] [address] [place], [place], [address] Phone: [phone]   Fax:  [phone]  Name: Sheila Roberson MRN: [id number] Date of Birth: [birth year]     Letter of Medical Necessity:  To Whom it may concern, Tonnia Bardin is a 23 y/o with cerebellar atrophy and cerebral palsy. Pt presents with mother to help obtain  medical necessity for grab bar installation in bathroom. PT was not able to go to home to perform evaluation. Per video of bathroom in home from patient's mother, patient had been utilizing towel rack in front of toilet to steady herself when toileting. Wall is starting to crack as towel rack not meant to support weight and at danger of pulling out of wall. Pt is high fall risk and needs support to maintain standing. Pt braces legs on chair behind her to maintain balance. Without UE support, only able to stand about 5 sec with close SBA for safety. Pt will benefit from grab bar to improve safety with toileting positioned in front of toilet. Pt is also having difficulty with getting in to tub as unable to use transfer bench safely due to toilet being in the way preventing her from sliding in. Mother has to remove chair and assist her to step in which has become increasingly more difficult to clear edge of bed. Pt would benefit from modified tub to make more handicap accessible with lower edge. This would further increase her safety as is high fall risk. Pt has had multiple falls in tub.  Elmer Bales, PT, DPT, NCS

## 2019-09-17 ENCOUNTER — Encounter: Payer: Medicaid Other | Admitting: Occupational Therapy

## 2019-09-18 ENCOUNTER — Other Ambulatory Visit: Payer: Self-pay

## 2019-09-18 ENCOUNTER — Ambulatory Visit: Payer: Medicaid Other | Attending: Nurse Practitioner

## 2019-09-18 ENCOUNTER — Ambulatory Visit: Payer: Medicaid Other | Admitting: Occupational Therapy

## 2019-09-18 DIAGNOSIS — R41840 Attention and concentration deficit: Secondary | ICD-10-CM

## 2019-09-18 DIAGNOSIS — R2689 Other abnormalities of gait and mobility: Secondary | ICD-10-CM

## 2019-09-18 DIAGNOSIS — R278 Other lack of coordination: Secondary | ICD-10-CM | POA: Diagnosis present

## 2019-09-18 DIAGNOSIS — M6281 Muscle weakness (generalized): Secondary | ICD-10-CM | POA: Diagnosis present

## 2019-09-18 DIAGNOSIS — R29818 Other symptoms and signs involving the nervous system: Secondary | ICD-10-CM

## 2019-09-18 NOTE — Therapy (Signed)
[facility] [address] [place], [place], [address] Phone: [phone]   Fax:  [phone]  Occupational Therapy Treatment  Patient Details  Name: Sheila Roberson MRN: [id number] Date of Birth: 1996-09-08 Referring Provider (OT): Tobie Lords FNP   Encounter Date: 09/18/2019  OT End of Session - 09/18/19 0854    Visit Number  1    Number of Visits  6    Date for OT Re-Evaluation  11/02/19    Authorization Type  Medicaid    OT Start Time  0803    OT Stop Time  0841    OT Time Calculation (min)  38 min       Past Medical History:  Diagnosis Date  . Cerebellar atrophy ([place])   . 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 Esotropia of both eyes   . History of dislocation of knee    bilateral---  wear knee braces  . Knee instability    BILATERAL WEAR BRACES  . Low muscle tone   . Mild cognitive impairment   . Seasonal allergies   . Uses wheelchair     Past Surgical History:  Procedure Laterality Date  . EXAMINATION UNDER ANESTHESIA N/A 01/01/2014   Procedure: EXAM UNDER ANESTHESIA;  Surgeon: Lahoma Crocker, MD;  Location: [facility];  Service: Gynecology;  Laterality: N/A;  . INTRAUTERINE DEVICE (IUD) INSERTION N/A 01/01/2014   Procedure: INTRAUTERINE DEVICE (IUD) INSERTION;  Surgeon: Lahoma Crocker, MD;  Location: [facility];  Service: Gynecology;  Laterality: N/A;  . IUD REMOVAL N/A 04/02/2014   Procedure: INTRAUTERINE DEVICE (IUD) REMOVAL;  Surgeon: Lahoma Crocker, MD;  Location: [facility];  Service: Gynecology;  Laterality: N/A;  . MEDIAN RECTUS REPAIR Bilateral 05/16/2016   Procedure: LATERAL RECTUS RESECTION BOTH EYES;  Surgeon: Gevena Cotton, MD;  Location: [facility];  Service: Ophthalmology;  Laterality: Bilateral;  . STRABISMUS SURGERY  2013    There were no vitals filed for  this visit.  Subjective Assessment - 09/18/19 0853    Patient Stated Goals  per pt's mother writing better, using scissors    Currently in Pain?  No/denies         [facility] OT Assessment - 09/18/19 3710      Assessment   Medical Diagnosis  cerebellar atrophy    Referring Provider (OT)  Tobie Lords FNP    Onset Date/Surgical Date  08/26/19    Hand Dominance  Right      Precautions   Precautions  Fall      Home  Environment   Family/patient expects to be discharged to:  Private residence    Available Help at Discharge  Family    Type of Home  --   mother   Bathroom Shower/Tub  Tub/Shower unit    Bathroom Accessibility  Yes    Home Equipment  Tub bench;Grab bars - toilet;Grab bars - tub/shower    Lives With  Family      Prior Function   Level of Independence  Needs assistance with ADLs;Needs assistance with gait    Vocation  Student    Leisure  softball and bowling through [place] with posterior walker at home      ADL   Eating/Feeding  Set up   needs assist with cutting food   Grooming  --  min-mod A hand over hand assit   Upper Body Bathing  Maximal assistance    Lower Body Bathing  Maximal assistance    Upper Body Dressing  Set up;Supervision/safety   except pt reuoires assist with bra   Lower Body Dressing  Set up;Supervision/safety    Toilet Transfer  Modified independent   grab bars   Tub/Shower Transfer  Supervision/safety   has tub bench   ADL comments  has aide who assist s with ADLS      Mobility   Mobility Status  --   supervison/ min A with walker     Written Expression   Dominant Hand  Right    Handwriting  --   currently traces items with 75% accuracy   Written Experience  --    writes J with 50% accuracy, max v.c, max A using scissors     Vision Assessment   Vision Assessment  Vision not tested      Cognition   Overall Cognitive Status  History of cognitive impairments - at baseline    Attention   Sustained    Sustained Attention  Impaired    Problem Solving  Impaired    Behaviors  Restless   Pt non verbal requiring v.c for initiation    Cognition Comments  mostly non verbal    inconsistently following 1 step commands     Sensation   Light Touch  Appears Intact      Coordination   Gross Motor Movements are Fluid and Coordinated  No    Fine Motor Movements are Fluid and Coordinated  No    Coordination  impaired bilaterally, able to place pegs and remove with both hands for 9 hole peg test, not timed, multi drops.      Tone   Assessment Location  Right Upper Extremity;Left Upper Extremity      ROM / Strength   AROM / PROM / Strength  AROM      AROM   Overall AROM   Within functional limits for tasks performed      Strength   Overall Strength Comments  Pt is grossly 4/5 UE      Hand Function   Right Hand Grip (lbs)  8.5    Left Hand Grip (lbs)  6.3      RUE Tone   RUE Tone  Hypotonic      RUE Tone   Hypotonic Details  mild      LUE Tone   LUE Tone  Hypotonic      LUE Tone   Hypotonic Details  mild                            OT Long Term Goals - 09/18/19 0855      OT LONG TERM GOAL #1   Title  Pt/ caregiever will be I with HEP.    Baseline  dependent    Time  5    Period  Weeks    Status  New    Target Date  11/02/19      OT LONG TERM GOAL #2   Title  Pt will trace letters, numbers and shapes with 90% accuracy.    Baseline  grossly75% accuracy for tracing    Time  5    Period  Weeks    Status  New      OT LONG TERM GOAL #3   Title  Pt will copy the letters  of her first name with 50% accuracy.    Baseline  Pt is currently tracing her name, she is able to to  copy 1 lettter of her name  with 50% accuracy.    Time  5    Period  Weeks    Status  New      OT LONG TERM GOAL #4   Title  Pt will use scissors with mod A or better.    Baseline  max A for scissor use    Time  5    Period  Weeks    Status  New      OT LONG TERM  GOAL #5   Title  Pt's caregiver will verbalize understanding of AE, or DME to assist with safety and I with ADLs/ IADLS prn.    Baseline  Pt may benefit from additional AE, or DME prn to assist with ADLs/ IADLs.    Time  5    Period  Weeks    Status  New            Plan - 09/18/19 0859    Clinical Impression Statement  Pt is 24 y/o female referred for cerebellar atrophty with cerebral palsy. Mother present and reports pt finished high school last year. Pt is nonverbal.Pt's mother has been working with her at home on school activities. Pt participates in softball and bowling at [facility] [employer] [facility] [place]. Pt presents with the following deficits: decreased strength, decreased fine motor coordination, decreased balance, decreased functional mobility, cognitive deficits which impedes perfromance of ADLs/ IADLs. Pt can benefit from skilled occupational therapy to maximize pt's safety and indpendence with daily activities.    OT Occupational Profile and History  Problem Focused Assessment - Including review of records relating to presenting problem    Occupational performance deficits (Please refer to evaluation for details):  ADL's;IADL's;Play;Social Participation;Leisure    Body Structure / Function / Physical Skills  ADL;UE functional use;Balance;FMC;ROM;Gait;Coordination;GMC;Decreased knowledge of precautions;Decreased knowledge of use of DME;IADL;Strength;Dexterity;Mobility    Cognitive Skills  Attention;Problem Solve;Safety Awareness;Sequencing;Thought;Understand    Rehab Potential  Good    Clinical Decision Making  Several treatment options, min-mod task modification necessary    Comorbidities Affecting Occupational Performance:  May have comorbidities impacting occupational performance    Modification or Assistance to Complete Evaluation   Min-Moderate modification of tasks or assist with assess necessary to complete eval    OT Frequency  1x / week    OT Duration  --   5 weeks   OT  Treatment/Interventions  Self-care/ADL training;Energy conservation;Patient/family education;DME and/or AE instruction;Passive range of motion;Gait Training;Balance training;Fluidtherapy;Cryotherapy;Functional Mobility Training;Splinting;Therapeutic activities;Manual Therapy;Therapeutic exercise;Moist Heat;Neuromuscular education;Cognitive remediation/compensation    Plan  HEP for coordination and grip strength, tracing activities    Consulted and Agree with Plan of Care  Patient;Family member/caregiver       Patient will benefit from skilled therapeutic intervention in order to improve the following deficits and impairments:   Body Structure / Function / Physical Skills: ADL, UE functional use, Balance, FMC, ROM, Gait, Coordination, GMC, Decreased knowledge of precautions, Decreased knowledge of use of DME, IADL, Strength, Dexterity, Mobility Cognitive Skills: Attention, Problem Solve, Safety Awareness, Sequencing, Thought, Understand     Visit Diagnosis: Muscle weakness (generalized) - Plan: Ot plan of care cert/re-cert  Other abnormalities of gait and mobility - Plan: Ot plan of care cert/re-cert  Other lack of coordination - Plan: Ot plan of care cert/re-cert  Attention and concentration deficit - Plan: Ot plan of care cert/re-cert  Other symptoms and signs involving the nervous system - Plan: Ot plan of care cert/re-cert    Problem List There are no problems to display for this patient.   Jasean Ambrosia 09/18/2019, 9:18 AM Keene Breath, OTR/L [phone] Phone: [phone] 9:18 AM 09/18/19 [facility] [address] [place], [place], [address] Phone: [phone]   Fax:  [phone]  Name: Sheila Roberson MRN: [id number] Date of Birth: Jun 12, 1996

## 2019-09-18 NOTE — Therapy (Signed)
[facility] [address] [place], [place], [address] Phone: [phone]   Fax:  [phone]  Physical Therapy Treatment  Patient Details  Name: Sheila Roberson MRN: [id number] Date of Birth: September 19, 1995 Referring Provider (PT): Azzie Roup, FNP   Encounter Date: 09/18/2019  PT End of Session - 09/18/19 0859    Visit Number  2    Number of Visits  7    Date for PT Re-Evaluation  10/27/19    Authorization Type  3 visits 1/8-1/28/2021    Authorization - Visit Number  1    Authorization - Number of Visits  3    PT Start Time  0845    PT Stop Time  0928    PT Time Calculation (min)  43 min    Equipment Utilized During Treatment  --   posterior walker   Activity Tolerance  Patient tolerated treatment well    Behavior During Therapy  [facility] for tasks assessed/performed       Past Medical History:  Diagnosis Date  . Cerebellar atrophy (HCC)   . 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 Esotropia of both eyes   . History of dislocation of knee    bilateral---  wear knee braces  . Knee instability    BILATERAL WEAR BRACES  . Low muscle tone   . Mild cognitive impairment   . Seasonal allergies   . Uses wheelchair     Past Surgical History:  Procedure Laterality Date  . EXAMINATION UNDER ANESTHESIA N/A 01/01/2014   Procedure: EXAM UNDER ANESTHESIA;  Surgeon: Antionette Char, MD;  Location: [facility];  Service: Gynecology;  Laterality: N/A;  . INTRAUTERINE DEVICE (IUD) INSERTION N/A 01/01/2014   Procedure: INTRAUTERINE DEVICE (IUD) INSERTION;  Surgeon: Antionette Char, MD;  Location: [facility];  Service: Gynecology;  Laterality: N/A;  . IUD REMOVAL N/A 04/02/2014   Procedure: INTRAUTERINE DEVICE (IUD) REMOVAL;  Surgeon: Antionette Char, MD;  Location: [facility];  Service: Gynecology;  Laterality: N/A;  . MEDIAN  RECTUS REPAIR Bilateral 05/16/2016   Procedure: LATERAL RECTUS RESECTION BOTH EYES;  Surgeon: Aura Camps, MD;  Location: [facility];  Service: Ophthalmology;  Laterality: Bilateral;  . STRABISMUS SURGERY  2013    There were no vitals filed for this visit.  Subjective Assessment - 09/18/19 0859    Subjective  Pt's mom reports having issues with getting pt in and out of tub as hard to step in and tub bench not working due to proximity of toilet. Requesting PT to add lower tub to letter of medical necessity. Pt has not had any new falls.    Patient is accompained by:  Family member    Pertinent History  cerebral palsy    Patient Stated Goals  Pt would like to be able to return to her sports of softball and bowling and perform better. Per mother, it would be nice if she could stand and use walker for softball instead of being pushed in w/c.    Currently in Pain?  No/denies                       [facility] Adult PT Treatment/Exercise - 09/18/19 0001      Transfers   Transfers  Sit to Stand;Stand to Sit    Sit to Stand  4: Min guard  Stand to Sit  4: Min guard      Ambulation/Gait   Ambulation/Gait  Yes    Ambulation/Gait Assistance  4: Min guard;4: Min assist    Ambulation/Gait Assistance Details  PT assisted to help maneuver posterior walker as one in clinic did not stop posterior motion and harder for patient to turn. Pt scissors with turning.    Ambulation Distance (Feet)  115 Feet    Assistive device  --   posterior walker   Gait Pattern  Step-through pattern;Scissoring    Ambulation Surface  Level;Indoor      Therapeutic Activites    Therapeutic Activities  Other Therapeutic Activities    Other Therapeutic Activities  Discussion with patient's mother who was asking about need for lower tub to hep improve safety with shower transfer. Per patient's mother and video that mother showed therapist pt is unable to utilize tube transfer bench to slide in due  to proximity of toilet. Mother has to remove bench when patient gets in shower and assist her to step in which has become more challenging with mother reporting multiple falls. PT had already requested grab bar be installed near toilet and added option of lower tub wall to letter of medical necessity to help this situation.      Neuro Re-ed    Neuro Re-ed Details   Standing in posterior walker with PT stabilizing walker to prevent it going backwards:attempted to get patient to let go with both hands but patient not wanting to, performed reaching with 1 UE in varied directions x 30 sec;  holding lightweight purple ball in both hands and raising overhead x 6 . Pt leaning posterior slightly against walker and given verbal cues to stand up straight, tossing ball back and forth with mom catching and PT guarding patient x 3 min, standing throwing bean bags from tray next to her in to basket x 14 with patient holding with 1 hand on walker, pt standing swinging plastic rod to knock tennis ball off another plastice rod x 6 to mimick her swinging softball bat. Pt's mom assist to set up ball and get ball so PT could guard. Pt performed seated in w/c kicking soccerball back and forth x 4 each leg. Mother participating throughout and instructed in how she could perform at home with caregiver. Explained that one person would need to stay to pt for safety while other person assisting with ball activities. Mother felt good with trying at home and excited to add to current exercises.             PT Education - 09/18/19 1050    Education Details  See neuro-reed section for standing and sitting functional activities mimicking her sport activities with mother and caregiver assisting- standing ball toss, standing hitting tball with bat, sitting soccerball kicks.    Person(s) Educated  Patient;Parent(s)    Methods  Explanation;Demonstration    Comprehension  Verbalized understanding;Returned demonstration       PT  Short Term Goals - 08/28/19 1949      PT SHORT TERM GOAL #1   Title  STG=LTG        PT Long Term Goals - 08/28/19 1949      PT LONG TERM GOAL #1   Title  Pt and family with be instructed in HEP for balance, strength and walking program to continue gains at home.    Baseline  currently only walking on treadmill 3x/week    Time  6    Period  Weeks    Status  New    Target Date  10/23/19   due to delay in scheduling     PT LONG TERM GOAL #2   Title  Pt will be able to stand at posterior walking and mimic t-ball batting for improved ability to participate in softball.    Baseline  currently has to perform from w/c.    Time  6    Period  Weeks    Status  New    Target Date  10/23/19      PT LONG TERM GOAL #3   Title  Pt will ambulate >300' with posterior walker supervision/CGA for improved gait safety and to be able to walk around  bases in softball.    Baseline  150' with posterior walker min assist to manuever, using w/c pushed by aide in softball.    Time  6    Period  Weeks    Status  New    Target Date  10/23/19            Plan - 09/18/19 1051    Clinical Impression Statement  Pt and mother instructed in standing balance HEP that was focused on mimicking her sport activities that she enjoyed. Pt does lean posterior some against walker. Her posterior walker at home prevents going back.    Examination-Activity Limitations  Transfers;Locomotion Level;Toileting;Stand;Bathing    Examination-Participation Restrictions  Community Activity    Stability/Clinical Decision Making  Stable/Uncomplicated    Rehab Potential  Good    PT Frequency  1x / week   plus eval   PT Duration  6 weeks    PT Treatment/Interventions  ADLs/Self Care Home Management;DME Instruction;Gait training;Stair training;Functional mobility training;Therapeutic activities;Therapeutic exercise;Patient/family education;Neuromuscular re-education;Balance training    PT Next Visit Plan  How has initial HEP of  sport simulation activities been going? Balance training working on standing in posterior walker with activities to simulate softball batting, gait training with posterior walker. Pt does much better with game activities. Perhaps try step negotiation as mother reported some decreased stability with descent with legs shaking.    Consulted and Agree with Plan of Care  Patient;Family member/caregiver    Family Member Consulted  mom       Patient will benefit from skilled therapeutic intervention in order to improve the following deficits and impairments:  Abnormal gait, Decreased balance, Decreased coordination, Postural dysfunction, Decreased strength, Decreased knowledge of use of DME  Visit Diagnosis: Other abnormalities of gait and mobility  Muscle weakness (generalized)     Problem List There are no problems to display for this patient.   Electa Sniff, PT, DPT, NCS 09/18/2019, 10:55 AM  [facility] [address] [place], [place], [address] Phone: [phone]   Fax:  [phone]  Name: Demetra Moya MRN: [id number] Date of Birth: [birth year]

## 2019-09-22 ENCOUNTER — Ambulatory Visit: Payer: Medicaid Other | Admitting: Physical Therapy

## 2019-09-23 ENCOUNTER — Other Ambulatory Visit: Payer: Self-pay

## 2019-09-23 ENCOUNTER — Ambulatory Visit (INDEPENDENT_AMBULATORY_CARE_PROVIDER_SITE_OTHER): Payer: Medicaid Other

## 2019-09-23 VITALS — Wt 163.0 lb

## 2019-09-23 DIAGNOSIS — N6311 Unspecified lump in the right breast, upper outer quadrant: Secondary | ICD-10-CM | POA: Diagnosis not present

## 2019-09-23 DIAGNOSIS — N6320 Unspecified lump in the left breast, unspecified quadrant: Secondary | ICD-10-CM | POA: Insufficient documentation

## 2019-09-23 DIAGNOSIS — N6321 Unspecified lump in the left breast, upper outer quadrant: Secondary | ICD-10-CM | POA: Diagnosis not present

## 2019-09-23 DIAGNOSIS — N6310 Unspecified lump in the right breast, unspecified quadrant: Secondary | ICD-10-CM | POA: Insufficient documentation

## 2019-09-23 NOTE — Patient Instructions (Signed)
Breast Cyst  A breast cyst is a sac in the breast that is filled with fluid. They are usually noncancerous (benign) and are common among women. Breast cysts are most often in the upper, outer portion of the breast. One or more cysts may develop. They form when fluid builds up inside the breast glands. There are several types of breast cysts. Some are too small to feel, but these can be seen with imaging tests such as an X-ray of the breast (mammogram) or ultrasound. Breast cysts do not increase your risk of breast cancer. They usually disappear after you no longer have a menstrual cycle (after menopause), unless you take artificial hormones (are on hormone therapy). What are the causes? This condition may be caused by:  Blockage of tubes (ducts) in the breast glands, which leads to fluid buildup. Duct blockage may result from: ? Fibrocystic breast changes. This is a common, benign condition that occurs when women go through hormonal changes during the menstrual cycle. This is a common cause of multiple breast cysts. ? Overgrowth of breast tissue or breast glands. ? Scar tissue in the breast from previous surgery.  Changes in certain female hormones (estrogen and progesterone). The exact cause of this condition is not known. What increases the risk? You may be more likely to develop breast cysts if you have not gone through menopause. What are the signs or symptoms? Symptoms of this condition include:  Feeling one or more smooth, round, soft lumps (like grapes) in the breast that are easily movable. The lump or lumps may get bigger and more painful before your menstrual period and get smaller after your menstrual period.  Breast discomfort or pain. How is this diagnosed? This condition may be diagnosed based on:  A physical exam. A cyst can be felt by your health care provider during this exam.  Imaging tests, such as mammogram or ultrasound. Fluid may be removed from the cyst with a  needle (fine-needle aspiration) and tested to make sure the cyst is not cancerous. How is this treated? Treatment may not be needed for this condition. Your health care provider may monitor the cyst to see if it goes away on its own. If the cyst is uncomfortable or gets bigger, or if you do not like how the cyst makes your breast look, you may need treatment. Treatment may include:  Hormone therapy.  Fine-needle aspiration to drain fluid from the cyst. There is a chance of the cyst coming back (recurring) after aspiration.  Surgery to remove the cyst. Follow these instructions at home: Self-exams   Do a breast self-exam every month, or as often as directed. A breast self-exam involves: ? Comparing your breasts in the mirror. ? Looking for visible changes in your skin or nipples. ? Feeling for lumps or changes.  Having many breast cysts may make it harder to feel for new lumps. Understand how your breasts normally look and feel, and write down any changes in your breasts. Tell your health care provider about any changes. Eating and drinking  Follow instructions from your health care provider about eating and drinking restrictions.  Drink enough fluid to keep your urine pale yellow.  Avoid caffeine.  Cut down on salt (sodium) in what you eat and drink, especially before your menstrual period. Too much sodium can cause fluid buildup, breast swelling, and discomfort. General instructions  See your health care provider regularly. ? Get a yearly physical exam. ? If you are 20-40 years old, get a   clinical breast exam every 1-3 years. After the age of 40 years, get this exam every year. ? Get mammograms as often as directed.  Take over-the-counter and prescription medicines only as told by your health care provider.  Wear a supportive bra, especially when exercising.  Keep all follow-up visits as told by your health care provider. This is important. Contact a health care provider  if:  You feel, or think you feel, a lump in your breast.  You notice that both breasts look or feel different than usual.  Your breast is still causing pain after your menstrual period is over.  You find new lumps or bumps that were not there before.  You feel lumps in your armpit. Get help right away if:  You have severe pain, tenderness, redness, or warmth in your breast.  You have fluid or blood leaking from your nipple.  Your breast lump becomes hard and painful.  You notice dimpling or wrinkling of the breast or nipple. Summary  A breast cyst is a sac in the breast that is filled with fluid.  Treatment may not be needed for this condition.  If the cyst is uncomfortable or gets bigger, or if you do not like how the cyst makes your breast look, you may need treatment. This information is not intended to replace advice given to you by your health care provider. Make sure you discuss any questions you have with your health care provider. Document Revised: 01/13/2019 Document Reviewed: 01/13/2019 Elsevier Patient Education  2020 Else[employer] Inc.  

## 2019-09-23 NOTE — Progress Notes (Signed)
GYNECOLOGY PROBLEM OFFICE VISIT NOTE  History:  Sheila Roberson is a 24 y.o. female who is a G0P0 here today for breast lump.  She comes with her mother who is her primary guardian and caretaker.  Mother, Sheila Roberson, reports that she noticed a recent enlargement of a known breast lump on Sheila Roberson's right breast.  She states that Sheila Roberson has been receiving ultrasounds of her breast since she was 56 with no significant findings. Sheila Roberson, she states they were not able to go in 2020 d/t Covid. Sheila Roberson states that she gave Sheila Roberson a bath on Sunday and noticed that the breast lump has enlarged and also moved as it was once by the nipple.  Sheila Roberson denies nipple discharge or skin changes.  Although unable to verbalize, Sheila Roberson states that the area is not painful. Sheila Roberson further reports that Sheila Roberson's paternal aunt has known fibrocystic breast and that she frequently has to get them drained.  Sheila Roberson reports Sheila Roberson's LMP was 09/08/2020 and was normal. Sheila Roberson expresses no other concerns regarding Sheila Roberson.   Past Medical History:  Diagnosis Date  . Cerebellar atrophy ([place])   . 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 Esotropia of both eyes   . History of dislocation of knee    bilateral---  wear knee braces  . Knee instability    BILATERAL WEAR BRACES  . Low muscle tone   . Mild cognitive impairment   . Seasonal allergies   . Uses wheelchair     Past Surgical History:  Procedure Laterality Date  . EXAMINATION UNDER ANESTHESIA N/A 01/01/2014   Procedure: EXAM UNDER ANESTHESIA;  Surgeon: Lahoma Crocker, MD;  Location: [facility];  Service: Gynecology;  Laterality: N/A;  . INTRAUTERINE DEVICE (IUD) INSERTION N/A 01/01/2014   Procedure: INTRAUTERINE DEVICE (IUD) INSERTION;  Surgeon: Lahoma Crocker, MD;  Location: [facility];  Service: Gynecology;  Laterality: N/A;  . IUD  REMOVAL N/A 04/02/2014   Procedure: INTRAUTERINE DEVICE (IUD) REMOVAL;  Surgeon: Lahoma Crocker, MD;  Location: [facility];  Service: Gynecology;  Laterality: N/A;  . MEDIAN RECTUS REPAIR Bilateral 05/16/2016   Procedure: LATERAL RECTUS RESECTION BOTH EYES;  Surgeon: Gevena Cotton, MD;  Location: [facility];  Service: Ophthalmology;  Laterality: Bilateral;  . STRABISMUS SURGERY  2013    The following portions of the patient's history were reviewed and updated as appropriate: allergies, current medications, past family history, past medical history, past social history, past surgical history and problem list.   Health Maintenance:  No pap history d/t CP and need for sedation for pelvic exams.  Normal breast ultrasounds in Feb 2017, Aug 2017, April 2018, and April 2019.    Review of Systems:  Review of Systems - History obtained from mother and unobtainable from patient due to Nonverbal status.  Breast ROS: positive for - new or changing breast lumps negative for - nipple changes or nipple discharge   Objective:  Vitals: Wt 163 lb (73.9 kg)   BMI 27.76 kg/m   Physical Exam: Physical Exam Constitutional:      Appearance: Normal appearance.  HENT:     Head: Normocephalic and atraumatic.  Eyes:     Conjunctiva/sclera: Conjunctivae normal.  Cardiovascular:     Rate and Rhythm: Normal rate and regular rhythm.     Heart sounds: Normal heart sounds.  Pulmonary:  Effort: Pulmonary effort is normal. No respiratory distress.     Breath sounds: Normal breath sounds.  Chest:     Breasts:        Right: Tenderness present. No inverted nipple, nipple discharge or skin change.        Left: Mass and tenderness present. No inverted nipple, nipple discharge or skin change.       Comments: Exam limited by patient's refusal to lay flat on exam table.  Lymphadenopathy:     Upper Body:     Right upper body: Axillary adenopathy present.  Neurological:     Mental Status: She is  alert.     Comments: Alert, Responsive to Yes/No Questions Appropriately with Physical replies.   Skin:    General: Skin is warm and dry.  Psychiatric:        Mood and Affect: Mood normal.        Behavior: Behavior normal.     Labs and Imaging: No results found.  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 Jhaveri informed that due to perceived increase in size and tenderness with palpation would send for new ultrasound. -Will also order ultrasound for new mass on left breast near nipple.  -Informed that ultrasound will be completed at [facility]. -Ms. Whan without further questions or concerns.  -Order placed for complete right and left breast US including axillary. -Discussed plan to [facility] in 6 months for annual well woman exam. -Encouraged to call if questions or concerns arise prior to that time.    Total face-to-face time with patient: 15 minutes   Lyndsay, Talamante, CNM 09/23/2019 8:45 AM

## 2019-09-23 NOTE — Progress Notes (Signed)
RGYN pt presents for problem visit today.   C/O: Lump in Right breast    Pt mom states she was told she would need a evaluation / referral in order to get imaging studies.  Per pt mom lump feels  Bigger   LMP: 09/09/2019

## 2019-10-01 ENCOUNTER — Ambulatory Visit: Payer: Medicaid Other

## 2019-10-02 ENCOUNTER — Other Ambulatory Visit: Payer: Self-pay

## 2019-10-02 ENCOUNTER — Ambulatory Visit
Admission: RE | Admit: 2019-10-02 | Discharge: 2019-10-02 | Disposition: A | Discharge status: Home / Self Care | Payer: Medicaid Other | Source: Ambulatory Visit

## 2019-10-02 DIAGNOSIS — N6310 Unspecified lump in the right breast, unspecified quadrant: Secondary | ICD-10-CM

## 2019-10-02 DIAGNOSIS — N6320 Unspecified lump in the left breast, unspecified quadrant: Secondary | ICD-10-CM

## 2019-10-16 ENCOUNTER — Other Ambulatory Visit: Payer: Self-pay

## 2019-10-16 ENCOUNTER — Ambulatory Visit: Payer: Medicaid Other | Attending: Nurse Practitioner | Admitting: Occupational Therapy

## 2019-10-16 DIAGNOSIS — R278 Other lack of coordination: Secondary | ICD-10-CM | POA: Diagnosis present

## 2019-10-16 DIAGNOSIS — R2689 Other abnormalities of gait and mobility: Secondary | ICD-10-CM | POA: Diagnosis present

## 2019-10-16 DIAGNOSIS — M6281 Muscle weakness (generalized): Secondary | ICD-10-CM | POA: Diagnosis not present

## 2019-10-16 DIAGNOSIS — R41840 Attention and concentration deficit: Secondary | ICD-10-CM | POA: Insufficient documentation

## 2019-10-16 DIAGNOSIS — R29818 Other symptoms and signs involving the nervous system: Secondary | ICD-10-CM | POA: Diagnosis present

## 2019-10-16 NOTE — Therapy (Signed)
[facility] [address] [place], [place], [address] Phone: [phone]   Fax:  [phone]  Occupational Therapy Treatment  Patient Details  Name: Sheila Roberson MRN: [id number] Date of Birth: 09-23-1995 Referring Provider (OT): Azzie Roup FNP   Encounter Date: 10/16/2019  OT End of Session - 10/16/19 0901    Visit Number  2    Number of Visits  6    Date for OT Re-Evaluation  11/02/19    Authorization Type  Medicaid    Authorization Time Period  5 visits through 11/19/19    Authorization - Visit Number  1    Authorization - Number of Visits  5    OT Start Time  0849    OT Stop Time  0930    OT Time Calculation (min)  41 min    Activity Tolerance  Patient tolerated treatment well    Behavior During Therapy  [facility] for tasks assessed/performed       Past Medical History:  Diagnosis Date  . Cerebellar atrophy (HCC)   . 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 Esotropia of both eyes   . History of dislocation of knee    bilateral---  wear knee braces  . Knee instability    BILATERAL WEAR BRACES  . Low muscle tone   . Mild cognitive impairment   . Seasonal allergies   . Uses wheelchair     Past Surgical History:  Procedure Laterality Date  . EXAMINATION UNDER ANESTHESIA N/A 01/01/2014   Procedure: EXAM UNDER ANESTHESIA;  Surgeon: Antionette Char, MD;  Location: [facility];  Service: Gynecology;  Laterality: N/A;  . INTRAUTERINE DEVICE (IUD) INSERTION N/A 01/01/2014   Procedure: INTRAUTERINE DEVICE (IUD) INSERTION;  Surgeon: Antionette Char, MD;  Location: [facility];  Service: Gynecology;  Laterality: N/A;  . IUD REMOVAL N/A 04/02/2014   Procedure: INTRAUTERINE DEVICE (IUD) REMOVAL;  Surgeon: Antionette Char, MD;  Location: [facility];  Service: Gynecology;  Laterality: N/A;  . MEDIAN RECTUS REPAIR  Bilateral 05/16/2016   Procedure: LATERAL RECTUS RESECTION BOTH EYES;  Surgeon: Aura Camps, MD;  Location: [facility];  Service: Ophthalmology;  Laterality: Bilateral;  . STRABISMUS SURGERY  2013    There were no vitals filed for this visit.  Subjective Assessment - 10/16/19 1233    Patient Stated Goals  per pt's mother writing better, using scissors    Currently in Pain?  No/denies             Treatment: tracing circles then letter of the alphabet, mod v.c and occasional hand over hand assist. Pt remains easily distracted requiring re-directs.              OT Education - 10/16/19 1157    Education Details  Pt/ mother were instucted in yellow theraputty HEP for grip and pinch, pt returned demonstraion with therapist modeling and mod v.c    Person(s) Educated  Patient;Parent(s)    Methods  Explanation;Demonstration;Verbal cues    Comprehension  Verbalized understanding;Returned demonstration;Verbal cues required          OT Long Term Goals - 09/18/19 0855      OT LONG TERM GOAL #1   Title  Pt/ caregiever witll be I with HEP.    Baseline  dependent    Time  5    Period  Weeks  Status  New    Target Date  11/02/19      OT LONG TERM GOAL #2   Title  Pt will trace letter, numbers and shapes with 90% accuracy.    Baseline  grossly75% accuracy for tracing    Time  5    Period  Weeks    Status  New      OT LONG TERM GOAL #3   Title  Pt will copy the letters of her first name with 50% accuracy.    Baseline  Pt is currently tracing her name, she is able to to  copy 1 lettter of her name  with 50% accuracy.    Time  5    Period  Weeks    Status  New      OT LONG TERM GOAL #4   Title  Pt will use scissors with mod A or better.    Baseline  max A for scissor use    Time  5    Period  Weeks    Status  New      OT LONG TERM GOAL #5   Title  Pt's caregiver will verbalize understanding of AE, or DME to assist with safety and I with ADLs/  IADLS prn.    Baseline  Pt may benefit from additional AE, or DME prn to assist with ADLs/ IADLs.    Time  5    Period  Weeks    Status  New            Plan - 10/16/19 1155    Clinical Impression Statement  Pt is progressing towards goals. She performed tracing activities in a busy environment and participated well in yellow theraputty HEP.    OT Occupational Profile and History  Problem Focused Assessment - Including review of records relating to presenting problem    Occupational performance deficits (Please refer to evaluation for details):  ADL's;IADL's;Play;Social Participation;Leisure    Body Structure / Function / Physical Skills  ADL;UE functional use;Balance;FMC;ROM;Gait;Coordination;GMC;Decreased knowledge of precautions;Decreased knowledge of use of DME;IADL;Strength;Dexterity;Mobility    Cognitive Skills  Attention;Problem Solve;Safety Awareness;Sequencing;Thought;Understand    Rehab Potential  Good    Clinical Decision Making  Several treatment options, min-mod task modification necessary    Comorbidities Affecting Occupational Performance:  May have comorbidities impacting occupational performance    Modification or Assistance to Complete Evaluation   Min-Moderate modification of tasks or assist with assess necessary to complete eval    OT Frequency  1x / week    OT Duration  --   5 weeks   OT Treatment/Interventions  Self-care/ADL training;Energy conservation;Patient/family education;DME and/or AE instruction;Passive range of motion;Gait Training;Balance training;Fluidtherapy;Cryotherapy;Functional Mobility Training;Splinting;Therapeutic activities;Manual Therapy;Therapeutic exercise;Moist Heat;Neuromuscular education;Cognitive remediation/compensation    Plan  HEP for coordination, tracing activities, check on how putty ex are going    Consulted and Agree with Plan of Care  Patient;Family member/caregiver       Patient will benefit from skilled therapeutic intervention  in order to improve the following deficits and impairments:   Body Structure / Function / Physical Skills: ADL, UE functional use, Balance, FMC, ROM, Gait, Coordination, GMC, Decreased knowledge of precautions, Decreased knowledge of use of DME, IADL, Strength, Dexterity, Mobility Cognitive Skills: Attention, Problem Solve, Safety Awareness, Sequencing, Thought, Understand     Visit Diagnosis: Muscle weakness (generalized)  Other abnormalities of gait and mobility  Other lack of coordination  Attention and concentration deficit  Other symptoms and signs involving the nervous system  Problem List Patient Active Problem List   Diagnosis Date Noted  . Bilateral breast lump 09/23/2019    Sheila Roberson 10/16/2019, 12:33 PM  [facility] [address] [place], [place], [address] Phone: [phone]   Fax:  [phone]  Name: Sheila Roberson MRN: [id number] Date of Birth: [birth year]

## 2019-10-23 ENCOUNTER — Ambulatory Visit: Payer: Medicaid Other | Admitting: Occupational Therapy

## 2019-10-30 ENCOUNTER — Ambulatory Visit: Payer: Medicaid Other | Admitting: Occupational Therapy

## 2019-11-06 ENCOUNTER — Other Ambulatory Visit: Payer: Self-pay

## 2019-11-06 ENCOUNTER — Ambulatory Visit: Payer: Medicaid Other | Admitting: Occupational Therapy

## 2019-11-06 DIAGNOSIS — R278 Other lack of coordination: Secondary | ICD-10-CM

## 2019-11-06 DIAGNOSIS — R41840 Attention and concentration deficit: Secondary | ICD-10-CM

## 2019-11-06 DIAGNOSIS — M6281 Muscle weakness (generalized): Secondary | ICD-10-CM | POA: Diagnosis not present

## 2019-11-06 DIAGNOSIS — R29818 Other symptoms and signs involving the nervous system: Secondary | ICD-10-CM

## 2019-11-06 NOTE — Therapy (Signed)
[facility] [address] [place], [place], [address] Phone: [phone]   Fax:  [phone]  Occupational Therapy Treatment  Patient Details  Name: Sheila Roberson MRN: [id number] Date of Birth: [birth year] Referring Provider (OT): Tobie Lords FNP   Encounter Date: 11/06/2019  OT End of Session - 11/06/19 1034    Visit Number  3    Number of Visits  6    Date for OT Re-Evaluation  11/02/19    Authorization Type  Medicaid    Authorization Time Period  5 visits through 11/19/19    Authorization - Visit Number  2    Authorization - Number of Visits  5    OT Start Time  [id number]    OT Stop Time  0930    OT Time Calculation (min)  39 min    Activity Tolerance  Patient tolerated treatment well    Behavior During Therapy  [facility] for tasks assessed/performed       Past Medical History:  Diagnosis Date  . Cerebellar atrophy ([place])   . 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 Esotropia of both eyes   . History of dislocation of knee    bilateral---  wear knee braces  . Knee instability    BILATERAL WEAR BRACES  . Low muscle tone   . Mild cognitive impairment   . Seasonal allergies   . Uses wheelchair     Past Surgical History:  Procedure Laterality Date  . EXAMINATION UNDER ANESTHESIA N/A 01/01/2014   Procedure: EXAM UNDER ANESTHESIA;  Surgeon: Lahoma Crocker, MD;  Location: [facility];  Service: Gynecology;  Laterality: N/A;  . INTRAUTERINE DEVICE (IUD) INSERTION N/A 01/01/2014   Procedure: INTRAUTERINE DEVICE (IUD) INSERTION;  Surgeon: Lahoma Crocker, MD;  Location: [facility];  Service: Gynecology;  Laterality: N/A;  . IUD REMOVAL N/A 04/02/2014   Procedure: INTRAUTERINE DEVICE (IUD) REMOVAL;  Surgeon: Lahoma Crocker, MD;  Location: [facility];  Service: Gynecology;  Laterality: N/A;  . MEDIAN RECTUS REPAIR  Bilateral 05/16/2016   Procedure: LATERAL RECTUS RESECTION BOTH EYES;  Surgeon: Gevena Cotton, MD;  Location: [facility];  Service: Ophthalmology;  Laterality: Bilateral;  . STRABISMUS SURGERY  2013    There were no vitals filed for this visit.  Subjective Assessment - 11/06/19 1034    Patient Stated Goals  per pt's mother writing better, using scissors    Currently in Pain?  No/denies              Treatment: Tracing letters with marker, mod v.c, min-mod difficulty Discussed use of foam grips on utensils and pt practiced cutting using a butter knife with foam grips, demonstration and mod v.c Pt's mother was provided with info for purchase of scoop dish as pt has used before. Reviewed yellow putty HEP, pt returned demonstration with therapsit demo and min-mod v.c Pt simulated wring out a washcloth.                  OT Long Term Goals - 11/06/19 0903      OT LONG TERM GOAL #1   Title  Pt/ caregiever witll be I with HEP.    Baseline  dependent    Time  5    Period  Weeks    Status  Achieved      OT LONG TERM GOAL #2  Title  Pt will trace letter, numbers and shapes with 90% accuracy.    Baseline  grossly75% accuracy for tracing    Time  5    Period  Weeks    Status  On-going      OT LONG TERM GOAL #3   Title  Pt will copy the letters of her first name with 50% accuracy.    Baseline  Pt is currently tracing her name, she is able to to  copy 1 lettter of her name  with 50% accuracy.    Time  5    Period  Weeks    Status  On-going      OT LONG TERM GOAL #4   Title  Pt will use scissors with mod A or better.    Baseline  max A for scissor use    Time  5    Period  Weeks    Status  New      OT LONG TERM GOAL #5   Title  Pt's caregiver will verbalize understanding of AE, or DME to assist with safety and I with ADLs/ IADLS prn.    Baseline  Pt may benefit from additional AE, or DME prn to assist with ADLs/ IADLs.    Time  5    Period   Weeks    Status  On-going            Plan - 11/06/19 1036    Clinical Impression Statement  Pt is progressing towards goals. Pt returned demonstration of yellow theraputty exercises. Pt's mother verbalizes understanding of use of foam grips for utensils and scoop dish.    OT Occupational Profile and History  Problem Focused Assessment - Including review of records relating to presenting problem    Occupational performance deficits (Please refer to evaluation for details):  ADL's;IADL's;Play;Social Participation;Leisure    Body Structure / Function / Physical Skills  ADL;UE functional use;Balance;FMC;ROM;Gait;Coordination;GMC;Decreased knowledge of precautions;Decreased knowledge of use of DME;IADL;Strength;Dexterity;Mobility    Cognitive Skills  Attention;Problem Solve;Safety Awareness;Sequencing;Thought;Understand    Rehab Potential  Good    Clinical Decision Making  Several treatment options, min-mod task modification necessary    Comorbidities Affecting Occupational Performance:  May have comorbidities impacting occupational performance    Modification or Assistance to Complete Evaluation   Min-Moderate modification of tasks or assist with assess necessary to complete eval    OT Frequency  1x / week    OT Duration  --   5 weeks   OT Treatment/Interventions  Self-care/ADL training;Energy conservation;Patient/family education;DME and/or AE instruction;Passive range of motion;Gait Training;Balance training;Fluidtherapy;Cryotherapy;Functional Mobility Training;Splinting;Therapeutic activities;Manual Therapy;Therapeutic exercise;Moist Heat;Neuromuscular education;Cognitive remediation/compensation    Plan  continue to work towards unmet goals, anticipate d/c next visit    Consulted and Agree with Plan of Care  Patient;Family member/caregiver       Patient will benefit from skilled therapeutic intervention in order to improve the following deficits and impairments:   Body Structure /  Function / Physical Skills: ADL, UE functional use, Balance, FMC, ROM, Gait, Coordination, GMC, Decreased knowledge of precautions, Decreased knowledge of use of DME, IADL, Strength, Dexterity, Mobility Cognitive Skills: Attention, Problem Solve, Safety Awareness, Sequencing, Thought, Understand     Visit Diagnosis: Muscle weakness (generalized)  Other lack of coordination  Attention and concentration deficit  Other symptoms and signs involving the nervous system    Problem List Patient Active Problem List   Diagnosis Date Noted  . Bilateral breast lump 09/23/2019    Donis Pinder 11/06/2019, 10:39 AM  [facility] [address] [place], [place], [address] Phone: [phone]   Fax:  [phone]  Name: Purity Irmen MRN: [id number] Date of Birth: 11-30-1995

## 2019-11-13 ENCOUNTER — Ambulatory Visit: Payer: Medicaid Other | Admitting: Occupational Therapy

## 2020-04-01 ENCOUNTER — Other Ambulatory Visit: Payer: Self-pay

## 2020-04-01 ENCOUNTER — Ambulatory Visit
Admission: RE | Admit: 2020-04-01 | Discharge: 2020-04-01 | Disposition: A | Discharge status: Home / Self Care | Payer: Medicaid Other | Source: Ambulatory Visit

## 2020-04-01 DIAGNOSIS — N6310 Unspecified lump in the right breast, unspecified quadrant: Secondary | ICD-10-CM

## 2020-04-01 DIAGNOSIS — N6320 Unspecified lump in the left breast, unspecified quadrant: Secondary | ICD-10-CM

## 2020-06-23 NOTE — Therapy (Signed)
[facility] [address] [place], [place], [address] Phone: [phone]   Fax:  [phone]  Patient Details  Name: Murna Backer MRN: [id number] Date of Birth: 1996-06-16 Referring Provider:  No ref. provider found  Encounter Date: 06/23/2020  PHYSICAL THERAPY DISCHARGE SUMMARY Non visit  Visits from Start of Care: 2  Current functional level related to goals / functional outcomes: Unknown as pt did not return after 2nd visit. Last visit was 09/18/19.    Remaining deficits: At last visit pt was having continued balance, strength and functional mobility deficits and mother was caregiver.    Education / Equipment: HEP  Plan: Patient agrees to discharge.  Patient goals were not met. Patient is being discharged due to not returning since the last visit.  ?????         Electa Sniff, PT, DPT, NCS 06/23/2020, 3:15 PM  [facility] [address] [place], [place], [address] Phone: [phone]   Fax:  [phone]

## 2020-06-24 ENCOUNTER — Ambulatory Visit: Payer: Medicaid Other | Attending: Family Medicine

## 2020-06-24 ENCOUNTER — Other Ambulatory Visit: Payer: Self-pay

## 2020-06-24 DIAGNOSIS — R29818 Other symptoms and signs involving the nervous system: Secondary | ICD-10-CM | POA: Diagnosis not present

## 2020-06-24 DIAGNOSIS — R2689 Other abnormalities of gait and mobility: Secondary | ICD-10-CM | POA: Insufficient documentation

## 2020-06-24 DIAGNOSIS — M6281 Muscle weakness (generalized): Secondary | ICD-10-CM | POA: Diagnosis present

## 2020-06-24 NOTE — Therapy (Signed)
[facility] [address] [place], [place], [address] Phone: [phone]   Fax:  [phone]  Physical Therapy Evaluation  Patient Details  Name: Sheila Roberson MRN: [id number] Date of Birth: 07-19-1996 Referring Provider (PT): Abran Duke, MD    Encounter Date: 06/24/2020   PT End of Session - 06/24/20 0925    Visit Number 1    Number of Visits 1    PT Start Time 0850    PT Stop Time 0927    PT Time Calculation (min) 37 min    Equipment Utilized During Treatment Gait belt    Activity Tolerance Patient tolerated treatment well    Behavior During Therapy Restless           Past Medical History:  Diagnosis Date  . Cerebellar atrophy (HCC)   . 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 Esotropia of both eyes   . History of dislocation of knee    bilateral---  wear knee braces  . Knee instability    BILATERAL WEAR BRACES  . Low muscle tone   . Mild cognitive impairment   . Seasonal allergies   . Uses wheelchair     Past Surgical History:  Procedure Laterality Date  . EXAMINATION UNDER ANESTHESIA N/A 01/01/2014   Procedure: EXAM UNDER ANESTHESIA;  Surgeon: Antionette Char, MD;  Location: [facility];  Service: Gynecology;  Laterality: N/A;  . INTRAUTERINE DEVICE (IUD) INSERTION N/A 01/01/2014   Procedure: INTRAUTERINE DEVICE (IUD) INSERTION;  Surgeon: Antionette Char, MD;  Location: [facility];  Service: Gynecology;  Laterality: N/A;  . IUD REMOVAL N/A 04/02/2014   Procedure: INTRAUTERINE DEVICE (IUD) REMOVAL;  Surgeon: Antionette Char, MD;  Location: [facility];  Service: Gynecology;  Laterality: N/A;  . MEDIAN RECTUS REPAIR Bilateral 05/16/2016   Procedure: LATERAL RECTUS RESECTION BOTH EYES;  Surgeon: Aura Camps, MD;  Location: [facility];  Service: Ophthalmology;  Laterality:  Bilateral;  . STRABISMUS SURGERY  2013    There were no vitals filed for this visit.    Subjective Assessment - 06/24/20 0856    Subjective Pt and mom are here to obtain another letter of medical necessity in order to install modified tub with lower lip. Pt is having difficulty with getting legs in the tub. Pt reports 1 fall 2 weeks ago in the kitchen with no serious injuries. Pt just went through bil breast biopsy. Pt completes 1 mile on treadmill 3x/week on level 1.7. Pt uses posterior walker sometimes in the home. Uses manual chair that her mother pushes in community. Pt had aide 10-7 7 day/week as well.    Patient is accompained by: Family member    Pertinent History CP,  global developmental delay, low muscle tone, cerebellar atrophy.    Patient Stated Goals Pt would like to be reassessed for handicap accessible tub.    Currently in Pain? 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 1    Has the patient had a decrease in activity level because of a fear of falling?  Yes    Is the patient reluctant to leave their home because of a fear of falling?  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      Prior Function   Level of Independence Needs assistance with ADLs;Needs assistance with gait    Vocation Unemployed   graduated last year   Leisure bowling, video games (Wii)      Cognition   Overall Cognitive Status History of cognitive impairments - at baseline    Sustained Attention Impaired    Problem Solving Impaired    Behaviors Restless   nonverbal      Observation/Other Assessments   Observations bil soft knee braces      Sensation   Light Touch Appears Intact      Tone   Assessment Location Right Lower Extremity;Left Lower Extremity      Strength   Overall Strength Comments Grossly 4/5 UE, except hands. Hands with decreased grip strength. Grossly 4/5 LE. Unable to formally asses as pt has diffculty following commands.      Transfers   Transfers Sit to Stand;Stand to Sit    Sit to Stand 4: Min guard    Stand to Sit 4: Min guard    Comments PT tried to simulate lifting leg over edge of tub seated with lifting over 13" step needing min assist to fully clear each leg at times as foot getting caught.      Ambulation/Gait   Ambulation/Gait Yes    Ambulation/Gait Assistance 4: Min assist    Ambulation/Gait Assistance Details PT assisted to manuever RW with turning and proper steering. Pt with decreased DF and hyper EXT as well as scissor gait with RLE at times.    Ambulation Distance (Feet) 70 Feet    Assistive device Rolling walker    Gait Pattern Poor foot clearance - right;Decreased dorsiflexion - right;Scissoring    Ambulation Surface Level;Indoor    Gait Comments Pt uses posterior walker at home and per mom rquiries less assistance than with RW.      High Level Balance   High Level Balance Comments Standing without UE support CGA/min assist as pt tends to lean backwards.      RLE Tone   RLE Tone Hypotonic      LLE Tone   LLE Tone  Hypotonic                      Objective measurements completed on examination: See above findings.               PT Education - 06/24/20 1259    Education Details Discussed PT plan of care. Pt's  mother wanting eval only trying to get letter of medical necessity.    Person(s) Educated Patient;Parent(s)    Methods Explanation    Comprehension Verbalized understanding                       Plan - 06/24/20 1557    Clinical Impression Statement Pt is 24 y/o female with global developmental delay, low muscle tone, cerebellar atrophy. low muscle. She presents for PT eval with her mother trying to get letter of medical necessity to get handicap accessible tub with lower wall to allow pt to clear her foot better. Pt has tub bench in current shower but still struggling to get leg in. Pt is high fall risk and unable to stand without UE support. She needs min assist to walk with walker. Pt has low tone. Uses manual w/c for community mobility with her caregivers pushing her. Pt was seen for eval only.    Personal Factors and Comorbidities Comorbidity 3+    Comorbidities global developmental delay, low muscle tone, cerebellar atrophy. low muscle    Examination-Activity Limitations Bathing;Stand;Locomotion Level;Transfers    Examination-Participation Restrictions Community Activity    Stability/Clinical Decision Making Stable/Uncomplicated    Clinical Decision Making Moderate    Rehab Potential Good    PT Frequency One time visit    PT Treatment/Interventions DME Instruction;Patient/family education;Functional mobility training;Gait training;Neuromuscular re-education;ADLs/Self Care Home Management    PT Next Visit Plan Eval only    Consulted and Agree with Plan of Care Patient;Family member/caregiver    Family Member Consulted mom, [name]           Patient will benefit from skilled therapeutic intervention in order to improve the following deficits and  impairments:  Abnormal gait, Decreased knowledge of use of DME, Decreased balance, Impaired tone, Decreased strength  Visit Diagnosis: Other abnormalities of gait and mobility  Muscle weakness (generalized)     Problem List Patient Active Problem List   Diagnosis Date Noted  . Bilateral breast lump 09/23/2019    Ronn Melena, PT, DPT, NCS 06/24/2020, 4:03 PM  [facility] [address] [place], [place], [address] Phone: [phone]   Fax:  [phone]  Name: Sheila Roberson MRN: [id number] Date of Birth: Dec 03, 1995   Letter of Medical Necessity:  To Whom it may concern, Sheila Roberson is a 24 y/o with cerebellar atrophy and cerebral palsy. Pt presents with mother to help obtain medical necessity for modification to tub to make more handicap accessible with lower edge.  PT was not able to go to home to perform evaluation. Pt is high fall risk and needs support to maintain standing. Pt braces legs on chair behind her to maintain balance. Pt leans posterior in standing and has had recent fall backwards.  Pt is having difficulty with getting in to tub as unable to use transfer bench safely due to toilet being in the way preventing her from sliding in. Mother has to remove chair and assist her to step in which has become increasingly more difficult to clear edge of tub. Pt would benefit from modified tub to make more handicap accessible with lower edge. This would further increase her safety as is high fall risk. Pt has had multiple falls in tub.  Elmer Bales, PT, DPT, NCS

## 2020-06-30 ENCOUNTER — Ambulatory Visit: Payer: Medicaid Other | Admitting: Occupational Therapy

## 2020-06-30 ENCOUNTER — Other Ambulatory Visit: Payer: Self-pay

## 2020-06-30 DIAGNOSIS — R29818 Other symptoms and signs involving the nervous system: Secondary | ICD-10-CM

## 2020-06-30 NOTE — Therapy (Signed)
[facility] [address] [place], [place], [address] Phone: [phone]   Fax:  [phone]  Patient Details  Name: Shantice Menger MRN: [id number] Date of Birth: 07-07-1996 Referring Provider:  Maudie Flakes, FNP  Encounter Date: 06/30/2020  Pt's mother arrived requesting letter of medical necessity for bathroom modifications.This letter had already been written by PT and therefore OT eval was withheld to avoid duplication of services. Pt' mother was provided with a copy of the letter of medical necessity Pt's mother reports pt is doing fine otherwise and she continues to perform previously issued OT exercises. No charge for this visit. Elenna Spratling 06/30/2020, 4:01 PM  [facility] [address] [place], [place], [address] Phone: [phone]   Fax:  [phone]

## 2020-10-04 ENCOUNTER — Other Ambulatory Visit: Payer: Medicaid Other

## 2021-01-02 IMAGING — US US BREAST*L* LIMITED INC AXILLA
1 series · 5 of 5 positions shown · non-contrast
Comparison: Previous exam(s).

CLINICAL DATA: 23-year-old patient presents for evaluation of new
bilateral breast masses. Her mother is with her today and provides
the history. The patient has completed 2 year follow-up at our
office previously for 2 probable fibroadenomas in the lower outer
quadrant of the right breast, 8 o'clock position.

Today, she presents for evaluation of a new palpable mass in the
axillary tail of the right breast and a new palpable mass in the
upper central left breast.
EXAM:
ULTRASOUND OF THE BILATERAL BREASTS

[Series 1: us breast*left* limited inc axilla · 0.08mm/px · 5 of 5 slices shown]
[im 1/5]
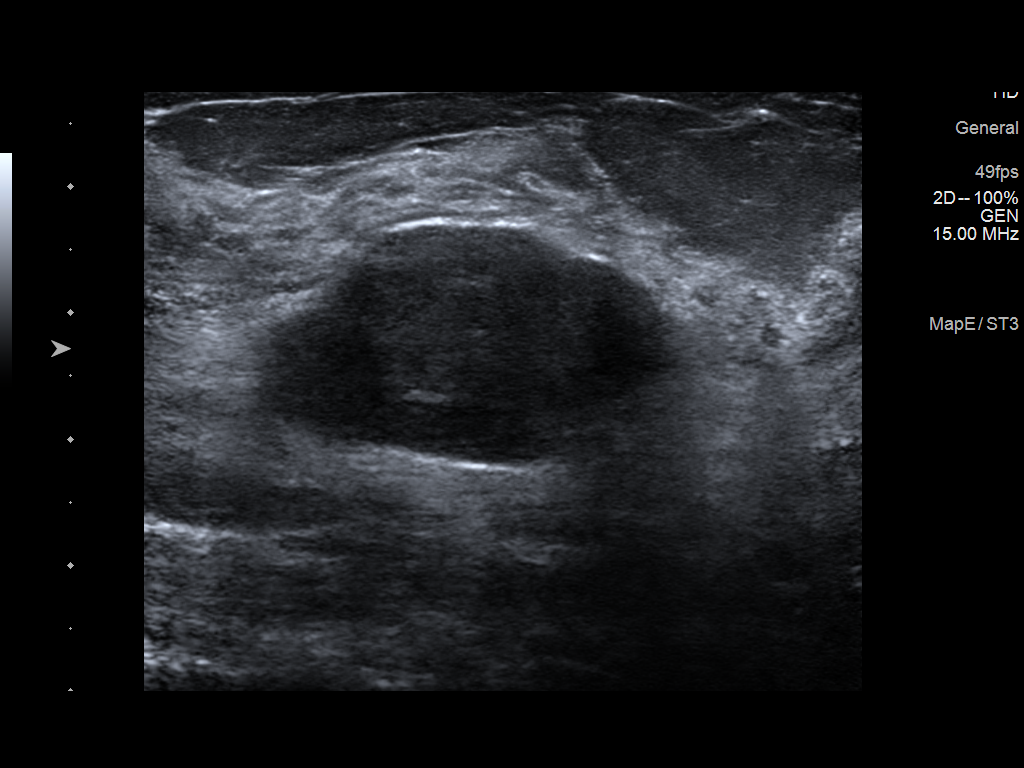
[im 2/5]
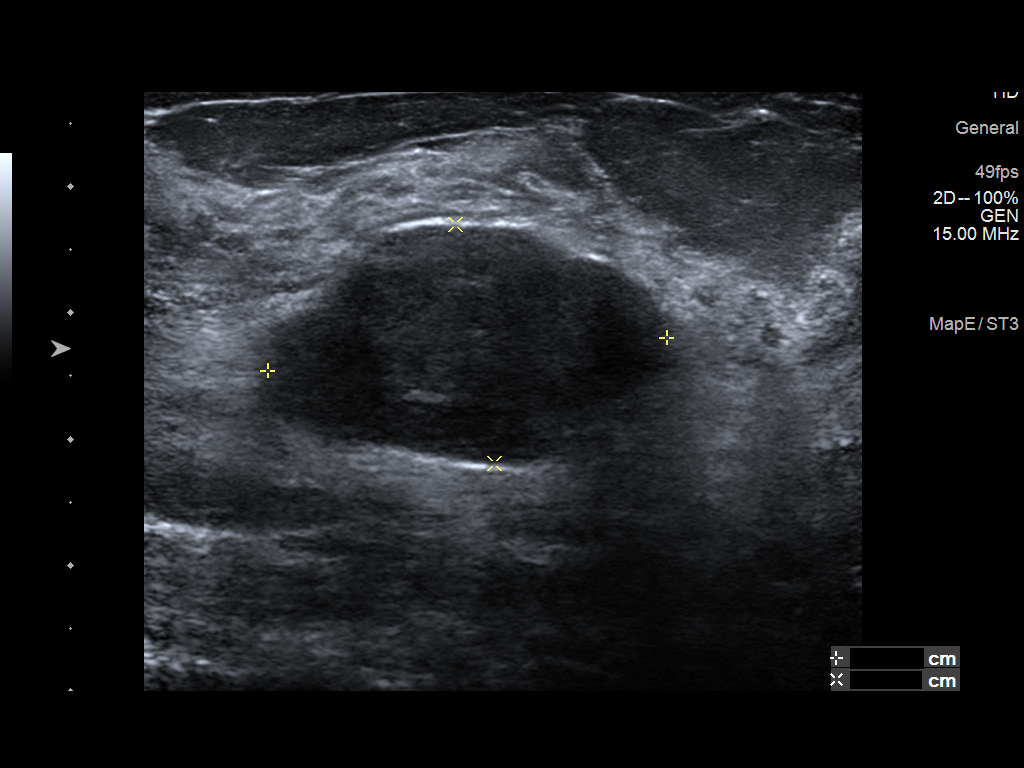
[im 3/5]
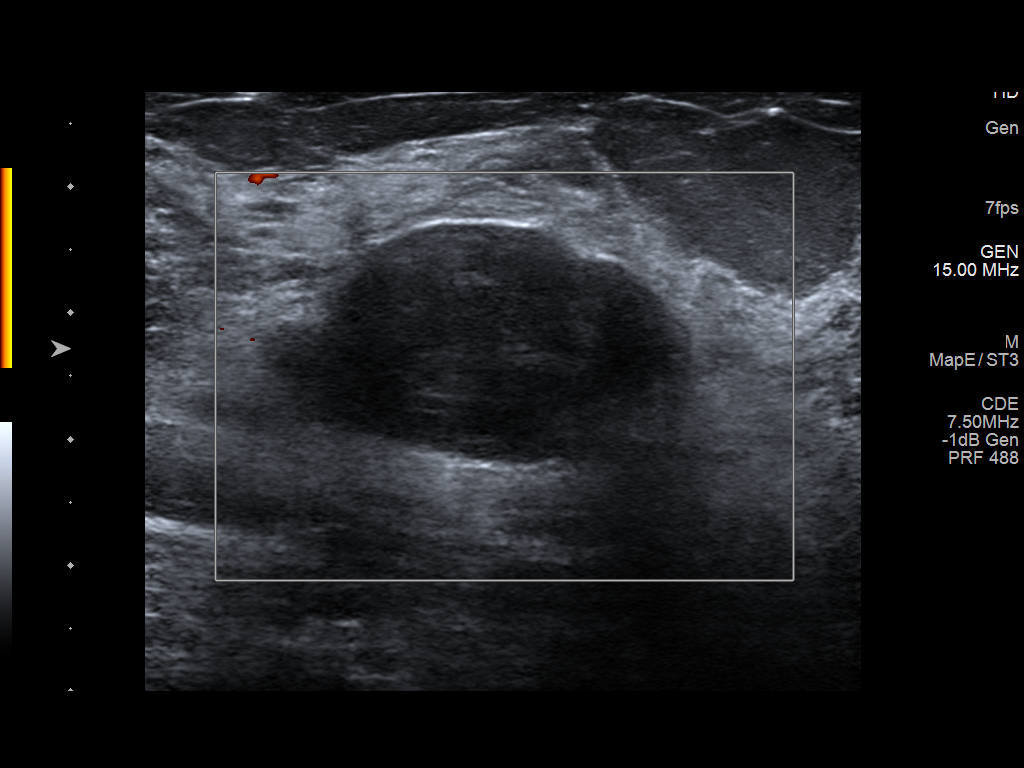
[im 4/5]
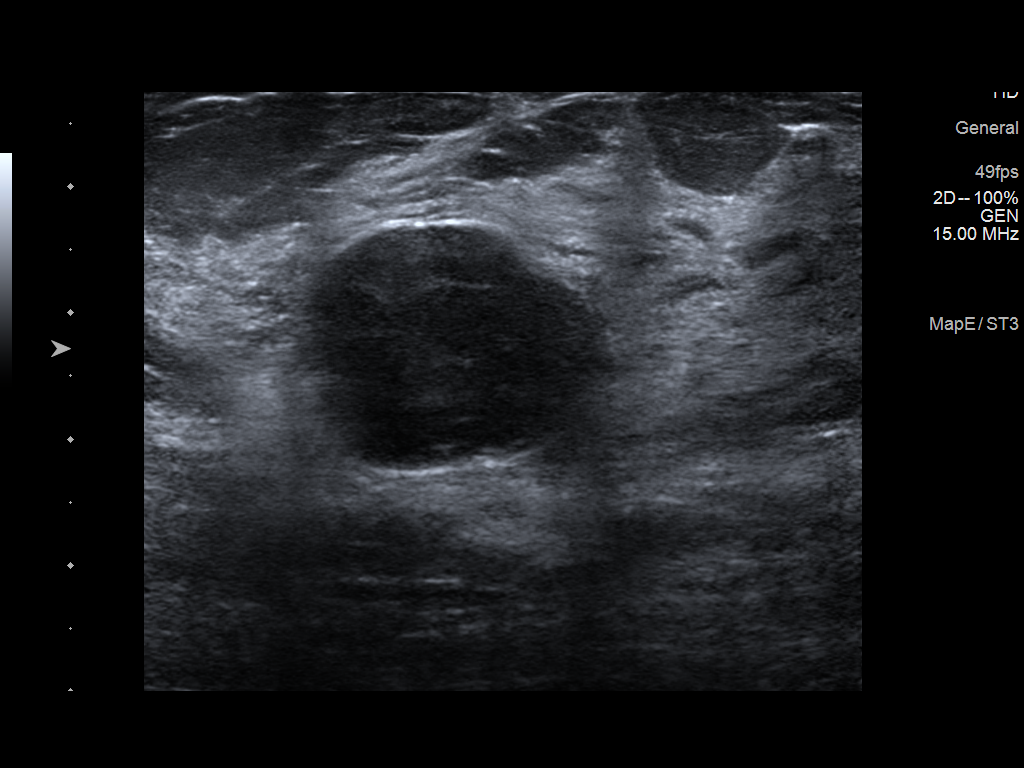
[im 5/5]
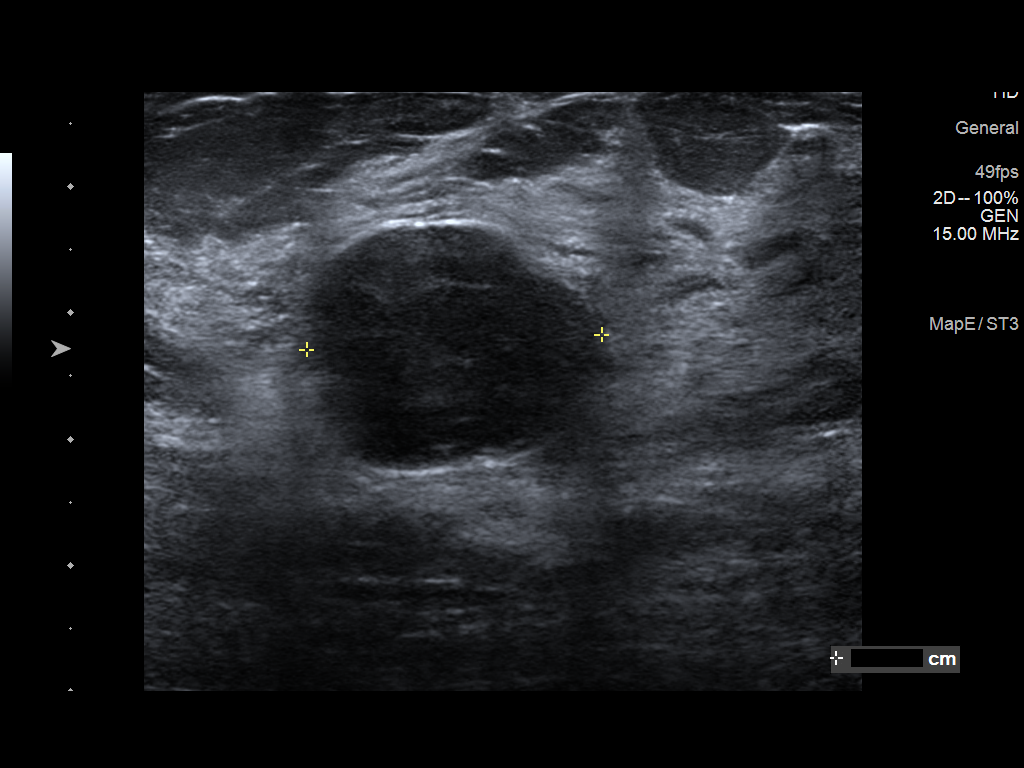

[5 of 5 positions shown; findings below may reference images not displayed]

FINDINGS: On physical exam, there is a smooth mobile approximately 2.5 cm mass
in the [DATE] position of the left breast 2 cm from the nipple.

There is a smooth mobile approximately 4 cm mass in the axillary
tail of the right breast [DATE] position.

Targeted ultrasound is performed, showing:

In the left breast at [DATE] position 2 cm from the nipple is a
homogeneously oval and gently lobulated mass measuring 3.2 x 1.9 x
2.3 cm. This has sonographic features consistent with a benign
fibroadenoma.

[DATE] position axillary tail right breast approximately 10 cm from
the nipple is a parallel slightly hypoechoic and gently lobulated
mass measuring 4.7 x 3.2 x 4.4 cm.
IMPRESSION: Probable fibroadenoma left breast [DATE] position.

Probable fibroadenoma right breast [DATE] position axillary tail.

RECOMMENDATION:
Bilateral breast ultrasounds are recommended in 6, 12, and 24 months
to confirm 2 year stability. The patient's mother also inquired
about surgical consultation for excision and we discussed this as a
possibility. At this point however, she prefers imaging follow-up.

Bilateral breast ultrasound is recommended in 6 months.

I have discussed the findings and recommendations with the patient.
If applicable, a reminder letter will be sent to the patient
regarding the next appointment.

BI-RADS CATEGORY  3: Probably benign.

## 2021-01-02 IMAGING — US US BREAST*R* LIMITED INC AXILLA
1 series · 5 of 5 positions shown · non-contrast
Comparison: Previous exam(s).

CLINICAL DATA: 23-year-old patient presents for evaluation of new
bilateral breast masses. Her mother is with her today and provides
the history. The patient has completed 2 year follow-up at our
office previously for 2 probable fibroadenomas in the lower outer
quadrant of the right breast, 8 o'clock position.

Today, she presents for evaluation of a new palpable mass in the
axillary tail of the right breast and a new palpable mass in the
upper central left breast.
EXAM:
ULTRASOUND OF THE BILATERAL BREASTS

[Series 1: us breast*right* limited inc axilla · 0.09mm/px · 5 of 5 slices shown]
[im 1/5]
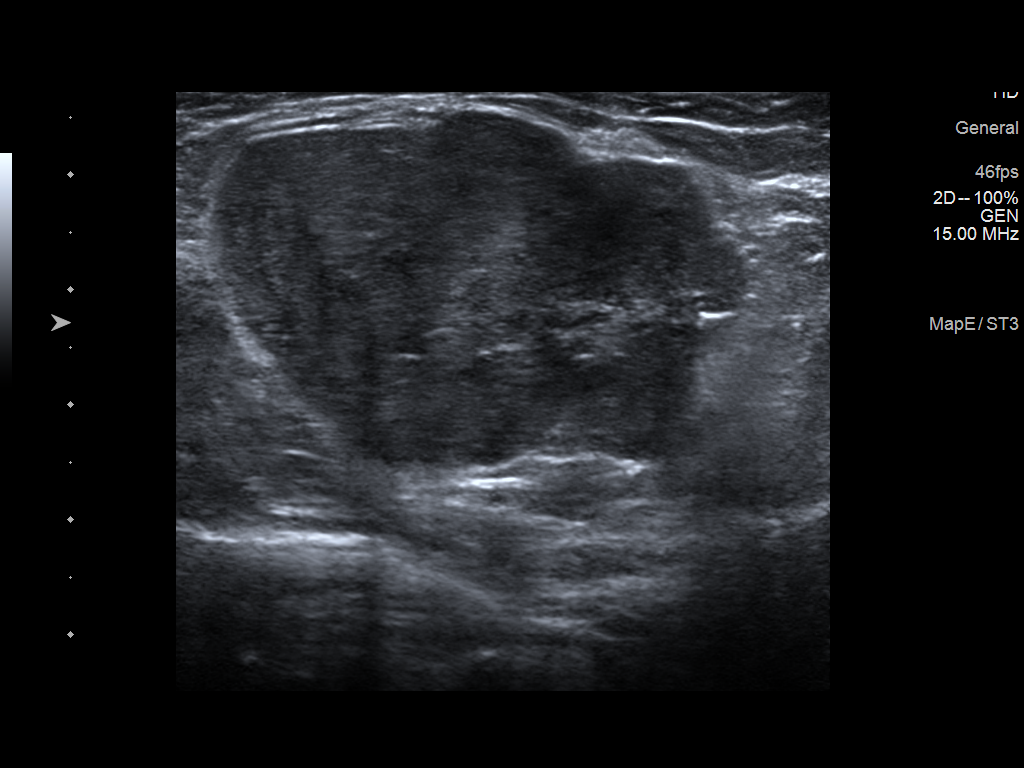
[im 2/5]
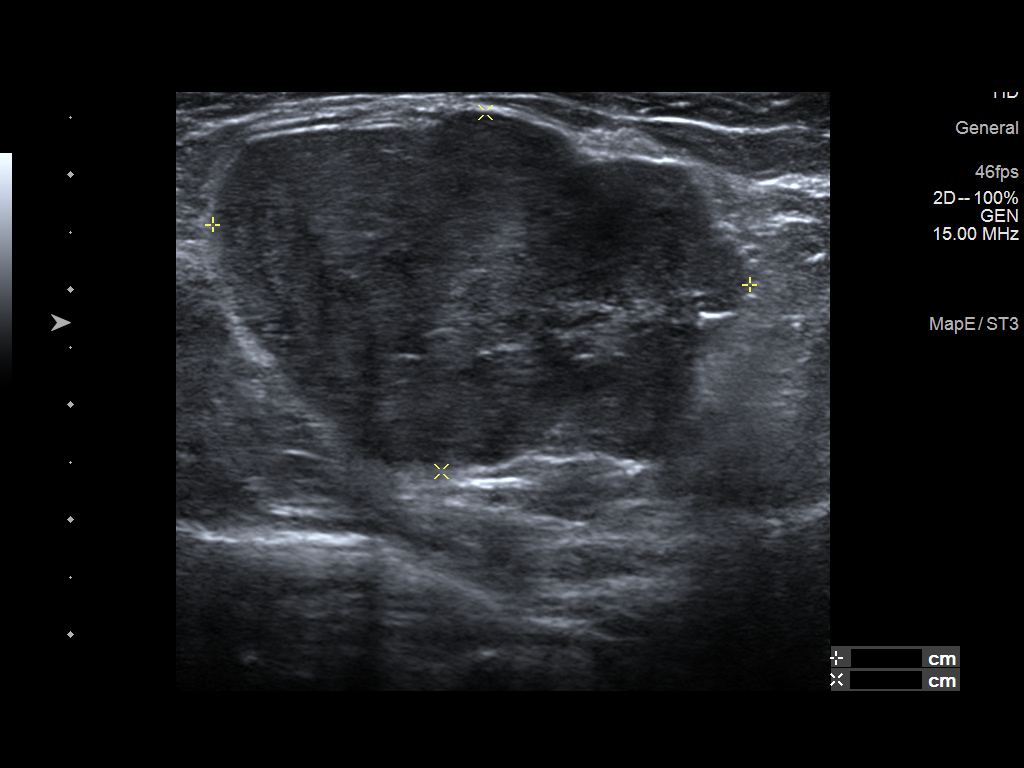
[im 3/5]
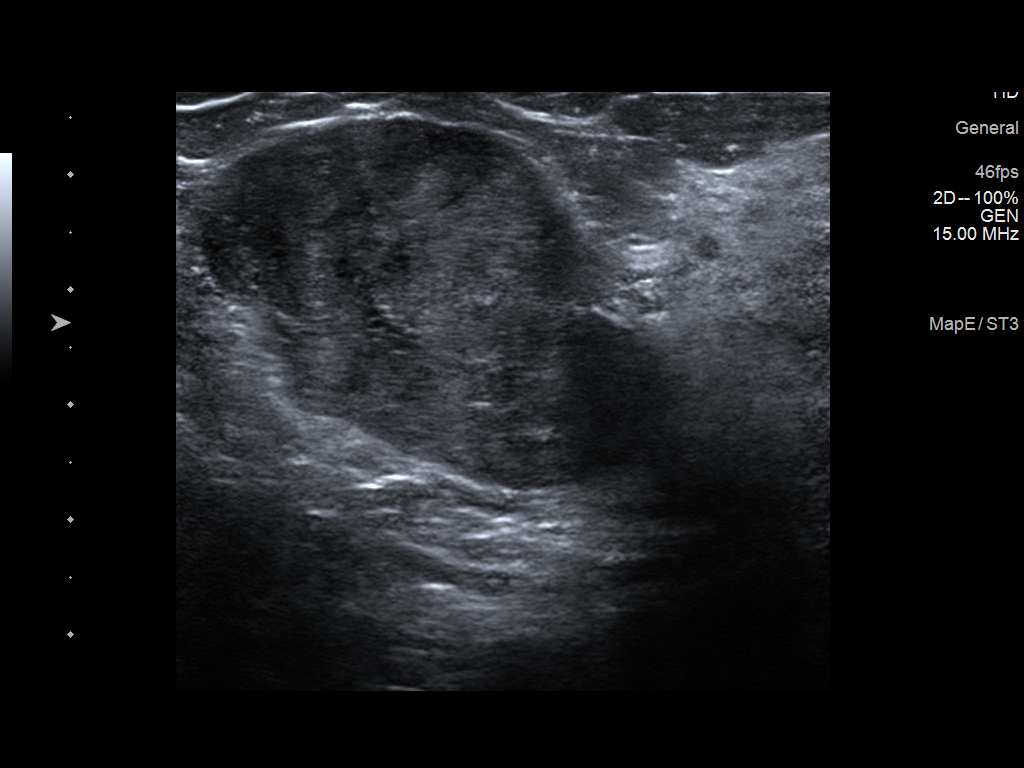
[im 4/5]
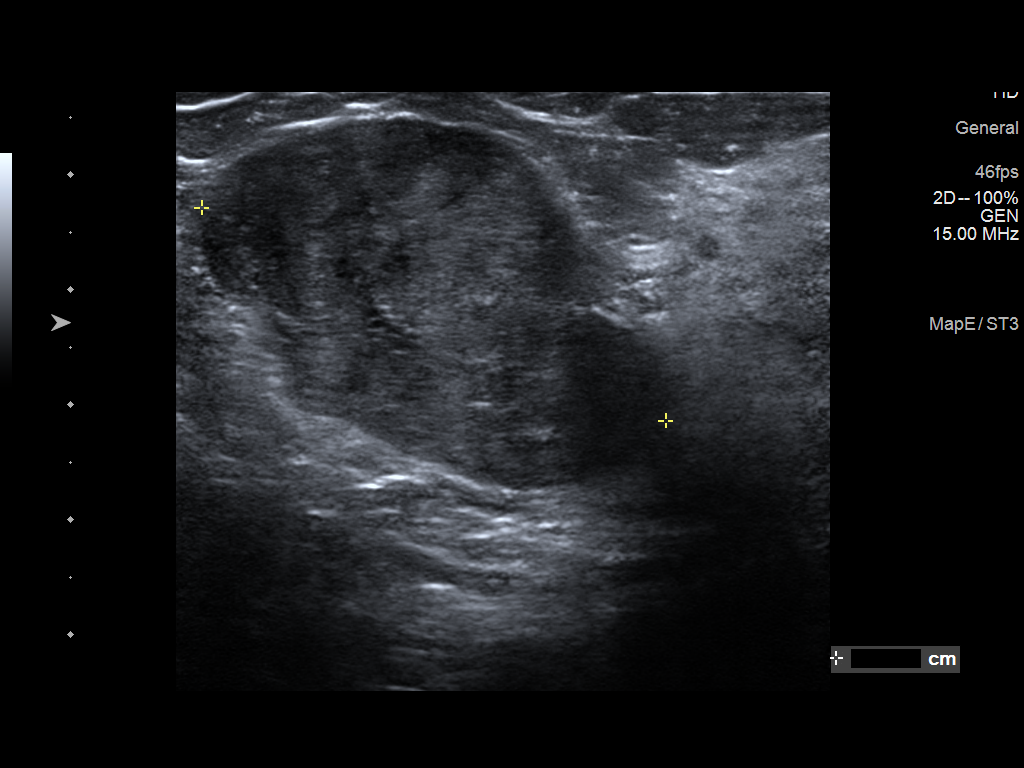
[im 5/5]
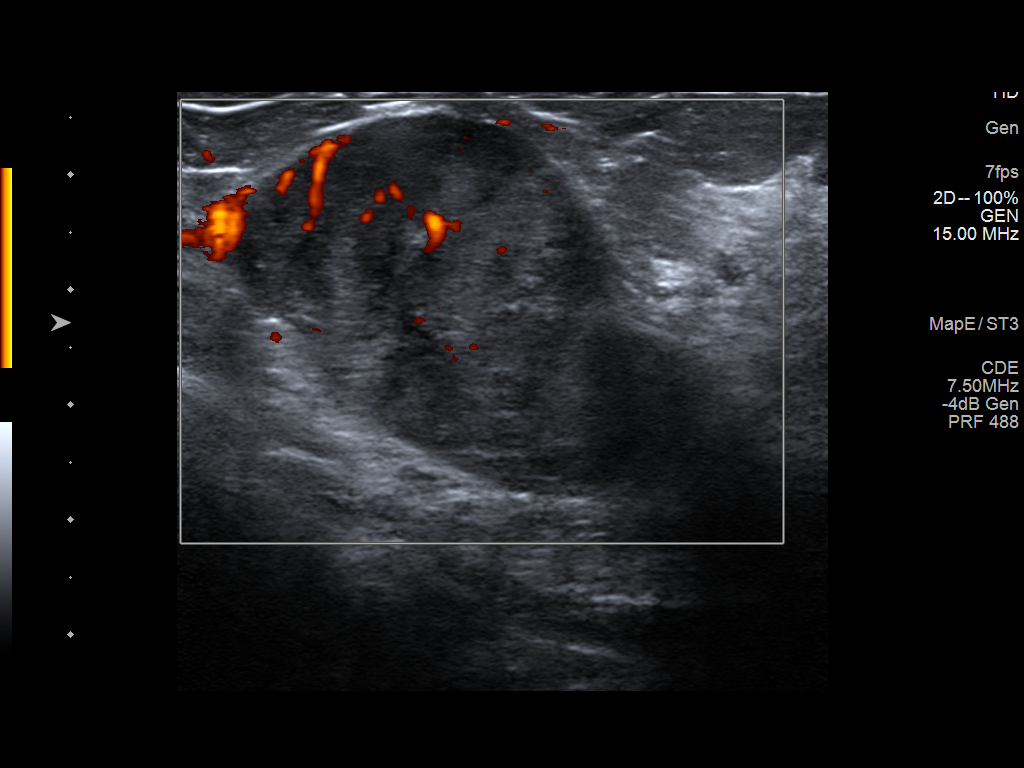

[5 of 5 positions shown; findings below may reference images not displayed]

FINDINGS: On physical exam, there is a smooth mobile approximately 2.5 cm mass
in the [DATE] position of the left breast 2 cm from the nipple.

There is a smooth mobile approximately 4 cm mass in the axillary
tail of the right breast [DATE] position.

Targeted ultrasound is performed, showing:

In the left breast at [DATE] position 2 cm from the nipple is a
homogeneously oval and gently lobulated mass measuring 3.2 x 1.9 x
2.3 cm. This has sonographic features consistent with a benign
fibroadenoma.

[DATE] position axillary tail right breast approximately 10 cm from
the nipple is a parallel slightly hypoechoic and gently lobulated
mass measuring 4.7 x 3.2 x 4.4 cm.
IMPRESSION: Probable fibroadenoma left breast [DATE] position.

Probable fibroadenoma right breast [DATE] position axillary tail.

RECOMMENDATION:
Bilateral breast ultrasounds are recommended in 6, 12, and 24 months
to confirm 2 year stability. The patient's mother also inquired
about surgical consultation for excision and we discussed this as a
possibility. At this point however, she prefers imaging follow-up.

Bilateral breast ultrasound is recommended in 6 months.

I have discussed the findings and recommendations with the patient.
If applicable, a reminder letter will be sent to the patient
regarding the next appointment.

BI-RADS CATEGORY  3: Probably benign.

## 2021-02-14 ENCOUNTER — Telehealth: Payer: Self-pay

## 2021-02-14 NOTE — Telephone Encounter (Signed)
PT called and spoke with Loma Newton (Patient's Mother) regarding scheduled PT evaluation for 02/14/21. Referral states reason for evaluation is to obtain a Letter of Medical Necessity for bathroom remodel. Mother states that Letter of Medical Necessity is for adjustment/remodel to sink/bathtub. PT informing that we are able to complete a Letter of Medical Necessit for equipment/assistive devices that are to be utilized in the bathroom, but unable to complete Letter of Medical Necessity for sink adjustment and/or tub remodel. Will need to reach out to PCP/MD regarding this. PT Evaluation will be cancelled. Can reach out if any further questions/concerns arise. Mother verbalized understanding.    Adelfa Koh, PT, DPT  [facility] [address] [place], [place]  [address] Phone:  [phone] Fax:  [phone]

## 2021-02-15 ENCOUNTER — Ambulatory Visit: Payer: Medicaid Other
